# Patient Record
Sex: Male | Born: 1960 | Race: White | Hispanic: No | Marital: Married | Smoking: Former smoker
Health system: Southern US, Community
[De-identification: ages and names within clinical notes are randomized; demographics above are authoritative.]

## PROBLEM LIST (undated history)

## (undated) DIAGNOSIS — I10 Essential (primary) hypertension: Secondary | ICD-10-CM

## (undated) DIAGNOSIS — F32A Depression, unspecified: Secondary | ICD-10-CM

## (undated) DIAGNOSIS — G4730 Sleep apnea, unspecified: Secondary | ICD-10-CM

## (undated) DIAGNOSIS — Z87442 Personal history of urinary calculi: Secondary | ICD-10-CM

---
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
## 2004-06-04 ENCOUNTER — Ambulatory Visit: Payer: Self-pay | Admitting: Internal Medicine

## 2005-05-27 HISTORY — PX: TOE SURGERY: SHX1073

## 2007-04-27 ENCOUNTER — Ambulatory Visit: Payer: Self-pay | Admitting: Podiatry

## 2007-05-06 ENCOUNTER — Ambulatory Visit: Payer: Self-pay | Admitting: Podiatry

## 2007-05-08 ENCOUNTER — Ambulatory Visit: Payer: Self-pay | Admitting: Podiatry

## 2009-08-15 ENCOUNTER — Ambulatory Visit: Payer: Self-pay | Admitting: Urology

## 2012-06-11 ENCOUNTER — Ambulatory Visit: Payer: Self-pay | Admitting: Gastroenterology

## 2013-05-27 HISTORY — PX: BACK SURGERY: SHX140

## 2013-08-31 ENCOUNTER — Ambulatory Visit: Payer: Self-pay | Admitting: Internal Medicine

## 2013-10-10 DIAGNOSIS — M5090 Cervical disc disorder, unspecified, unspecified cervical region: Secondary | ICD-10-CM | POA: Insufficient documentation

## 2014-05-27 HISTORY — PX: BACK SURGERY: SHX140

## 2014-10-27 ENCOUNTER — Other Ambulatory Visit: Payer: Self-pay | Admitting: Internal Medicine

## 2014-10-27 DIAGNOSIS — M5416 Radiculopathy, lumbar region: Secondary | ICD-10-CM

## 2014-10-29 ENCOUNTER — Ambulatory Visit
Admission: RE | Admit: 2014-10-29 | Discharge: 2014-10-29 | Disposition: A | Discharge status: Home / Self Care | Payer: BLUE CROSS/BLUE SHIELD | Source: Ambulatory Visit | Attending: Internal Medicine | Admitting: Internal Medicine

## 2014-10-29 DIAGNOSIS — M5416 Radiculopathy, lumbar region: Secondary | ICD-10-CM

## 2014-10-29 DIAGNOSIS — M5136 Other intervertebral disc degeneration, lumbar region: Secondary | ICD-10-CM | POA: Diagnosis present

## 2014-10-29 DIAGNOSIS — M5126 Other intervertebral disc displacement, lumbar region: Secondary | ICD-10-CM | POA: Insufficient documentation

## 2014-11-22 DIAGNOSIS — F1190 Opioid use, unspecified, uncomplicated: Secondary | ICD-10-CM | POA: Insufficient documentation

## 2014-11-29 DIAGNOSIS — Z9889 Other specified postprocedural states: Secondary | ICD-10-CM | POA: Insufficient documentation

## 2014-11-29 DIAGNOSIS — M48062 Spinal stenosis, lumbar region with neurogenic claudication: Secondary | ICD-10-CM | POA: Insufficient documentation

## 2015-02-01 ENCOUNTER — Encounter: Payer: Self-pay | Admitting: Physical Therapy

## 2015-02-01 ENCOUNTER — Ambulatory Visit: Payer: BLUE CROSS/BLUE SHIELD | Attending: Orthopedic Surgery | Admitting: Physical Therapy

## 2015-02-01 DIAGNOSIS — G729 Myopathy, unspecified: Secondary | ICD-10-CM | POA: Insufficient documentation

## 2015-02-01 DIAGNOSIS — M6281 Muscle weakness (generalized): Secondary | ICD-10-CM | POA: Insufficient documentation

## 2015-02-01 DIAGNOSIS — M6289 Other specified disorders of muscle: Secondary | ICD-10-CM

## 2015-02-01 DIAGNOSIS — M5441 Lumbago with sciatica, right side: Secondary | ICD-10-CM | POA: Insufficient documentation

## 2015-02-01 DIAGNOSIS — M2560 Stiffness of unspecified joint, not elsewhere classified: Secondary | ICD-10-CM | POA: Insufficient documentation

## 2015-02-01 NOTE — Therapy (Deleted)
[facility] [address] [place], [place], [address] Phone: [phone]   Fax:  [phone]  Physical Therapy Evaluation  Patient Details  Name: Darrell Schmidt MRN: [id number] Date of Birth: Mar 13, 1961 Referring Provider:  Marlane Mingle, MD  Encounter Date: 02/01/2015      PT End of Session - 02/01/15 1234    Visit Number 1   Number of Visits 8   Date for PT Re-Evaluation 03/01/15   PT Start Time 1035   PT Stop Time 1117   PT Time Calculation (min) 42 min   Activity Tolerance Patient tolerated treatment well;Patient limited by pain   Behavior During Therapy [facility] for tasks assessed/performed      No past medical history on file.  No past surgical history on file.  There were no vitals filed for this visit.  Visit Diagnosis:  Nonspecific pain in the lumbar region  Muscle tightness  Joint stiffness of spine      Subjective Assessment - 02/01/15 1223    Subjective Pt reports having L L4-L5 decompression on 11/29/14. Pt states that since the decompression he has a constant ache in his back (3/10) at rest and is easily aggrevated by prolonged sitting. 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 Pt reports feeling better when bending back or standing. Pt states he had Right Lumbar (L4/L5 decompression in June of 2015 with immediate relief of symtpoms). Pt states he currently is having some radiating pain into his R buttock and leg above the knee.    Limitations Sitting;Lifting   How long can you sit comfortably? < 10 mins    How long can you stand comfortably? > 15 mins    How long can you walk comfortably? > 15 mins    Diagnostic tests MRI    Patient Stated Goals return to yardwork/playing with son/return to work so able to sit without pain/attend son's baseball game    Currently in Pain? Yes   Pain Score 3    Pain Location Back    Pain Orientation Left;Lower;Right   Pain Descriptors / Indicators Aching   Pain Type Chronic pain   Pain Onset More than a month ago   Pain Frequency Intermittent   Aggravating Factors  sitting   Pain Relieving Factors extension            [facility] PT Assessment - 02/01/15 0001    Assessment   Medical Diagnosis --  Lumbar decompression (L4-5).   Onset Date/Surgical Date 11/29/14                            PT Education - 02/01/15 1232    Education provided Yes   Education Details Pt given stretching (see handout above) page 1-2 of TrA stabilization packet through marching (pain limiting factor with bridging).   Person(s) Educated Patient   Methods Explanation;Demonstration;Verbal cues;Handout   Comprehension Verbalized understanding;Returned demonstration            Plan - 02/01/15 1234    Clinical Impression Statement Pt is a 54 y.o M s/p L L4-L5 lumbar decompression (DOS: 11/29/14). Pt presents with 3/10 low back pain and states that is it constant. Pt reports occassional radiating pain into his R LE. Pt's pain is aggrevating by sitting. Pt lumbar ROM: 50% limited with Lateral flexion and rotation, WNL for  extension, 75% limited flexion secondary to pain. MMT B LE grossly 5/5 except for B hip abductors 4-/5. Pt has positive obers, pirformis and hamstring tightness (distal lacking 21 degrees, proximal 61 degrees). Pt has B negative thomas test. Pt has palapble tenderness and tightness with lumbar musculature (L> R). Pt has a positive L multifiudi arm raise test. Pt is hypomobile from L1-L5 with tenderness noted. Pt will benefit from skilled PT to address functional mobility deficits, address flexibilty deficits, strengthening and stabilization of TrA, address back pain and return to work demands.    Pt will benefit from skilled therapeutic intervention in order to improve on the following deficits Decreased activity tolerance;Hypomobility;Decreased  strength;Decreased mobility;Impaired flexibility;Improper body mechanics;Pain;Decreased endurance;Decreased range of motion   Rehab Potential Good   PT Frequency 2x / week   PT Duration 4 weeks   PT Next Visit Plan progress core strengthening/introduce some cardio/stretching/multifidi strengthening   PT Home Exercise Plan see handout; page 1-2 of core stabilization    Recommended Other Services walking program/some form of cardio exercise    Consulted and Agree with Plan of Care Patient         Problem List There are no active problems to display for this patient.   Curlene Dolphin [facility] 02/01/2015, 12:46 PM  [place] [address] [place], [place], [address] Phone: [phone]   Fax:  [phone]

## 2015-02-02 ENCOUNTER — Encounter: Payer: Self-pay | Admitting: Physical Therapy

## 2015-02-02 NOTE — Therapy (Signed)
[facility] [address] [place], [place], [address] Phone: [phone]   Fax:  [phone]  Physical Therapy Evaluation  Patient Details  Name: Darrell Schmidt MRN: [id number] Date of Birth: Apr 27, 1961 Referring Provider:  Marlane Mingle, MD  Encounter Date: 02/01/2015      PT End of Session - 02/01/15 1234    Visit Number 1   Number of Visits 8   Date for PT Re-Evaluation 03/01/15   PT Start Time 1035   PT Stop Time 1117   PT Time Calculation (min) 42 min   Activity Tolerance Patient tolerated treatment well;Patient limited by pain   Behavior During Therapy [facility] for tasks assessed/performed      History reviewed. No pertinent past medical history.  History reviewed. No pertinent past surgical history.  There were no vitals filed for this visit.  Visit Diagnosis:  Muscle weakness  Muscle tightness  Joint stiffness of spine  Bilateral low back pain with right-sided sciatica      Subjective Assessment - 02/01/15 1223    Subjective Pt reports having L L4-L5 decompression on 11/29/14. Pt states that since the decompression he has a constant ache in his back (3/10) at rest and is easily aggrevated by prolonged sitting. 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 Pt reports feeling better when bending back or standing. Pt states he had Right Lumbar (L4/L5 decompression in June of 2015 with immediate relief of symtpoms). Pt states he currently is having some radiating pain into his R buttock and leg above the knee.    Limitations Sitting;Lifting   How long can you sit comfortably? < 10 mins    How long can you stand comfortably? > 15 mins    How long can you walk comfortably? > 15 mins    Diagnostic tests MRI    Patient Stated Goals return to yardwork/playing with son/return to work so able to sit without pain/attend son's baseball  game    Currently in Pain? Yes   Pain Score 3    Pain Location Back   Pain Orientation Left;Lower;Right   Pain Descriptors / Indicators Aching   Pain Type Chronic pain   Pain Onset More than a month ago   Pain Frequency Intermittent   Aggravating Factors  sitting   Pain Relieving Factors extension      Standing lumbar  Flexion: 32 deg. (guarded/ pain limited).        Extension: 50 deg. (preferred movement).         Rotation: 50% limited (no increase c/o pain)       Lateral flexion: 50% limited (no increase c/o pain)   B LE muscle strength grossly 5/5 MMT except for B hip abductors 4-/5.  Slight increase in L lumbar pain with R hip flexion MMT.   Positive: obers, piriformis Bilaterally  Negative Thomas Test bilaterally  R prox. Hamstring: 68 deg./ distal hamstring: -21 deg. L prox. Hamstring: 65 deg./ distal hamstring:  -22 deg.        PT Education - 02/01/15 1232    Education provided Yes   Education Details Pt given stretching (see handout above) page 1-2 of TrA stabilization packet through marching (pain limiting factor with bridging).   Person(s) Educated Patient   Methods Explanation;Demonstration;Verbal cues;Handout   Comprehension Verbalized understanding;Returned demonstration  PT Long Term Goals - 02/01/15 1243    PT LONG TERM GOAL #1   Title Pt will complete ODI and decrease score to < 20% in order to improve functional mobility.    Baseline Pt took home to complete on 9/7   Time 4   Period Weeks   Status New   PT LONG TERM GOAL #2   Title Pt will report an increase in sitting tolerance with no c/o increased pain with 20 mins or greater in order to return to work.    Time 4   Period Weeks   Status New   PT LONG TERM GOAL #3   Title Pt I with HEP to improve B hip abductors strength by 1/2 MMT grade in order to play outside with his son.    Baseline 4-/5   Time 4   Period Weeks   Status New   PT LONG TERM GOAL #4   Title Pt will be able  to attend son's baseball game (sitting/standing posture) with no increase c/o back pain.   Time 4   Period Weeks   Status New             Plan - 02/01/15 1234    Clinical Impression Statement Pt is a 54 y.o M s/p L L4-L5 lumbar decompression (DOS: 11/29/14). Pt presents with 3/10 low back pain and states that is it constant. Pt reports occassional radiating pain into his R LE. Pt's pain is aggrevating by sitting. Pt lumbar ROM: 50% limited with Lateral flexion and rotation, WNL for extension, 75% limited flexion secondary to pain. MMT B LE grossly 5/5 except for B hip abductors 4-/5. Pt has positive obers, pirformis and hamstring tightness (distal lacking 21 degrees, proximal 61 degrees). Pt has B negative thomas test. Pt has palapble tenderness and tightness with lumbar musculature (L> R). Pt has a positive L multifiudi arm raise test. Pt is hypomobile from L1-L5 with tenderness noted. Pt will benefit from skilled PT to address functional mobility deficits, address flexibilty deficits, strengthening and stabilization of TrA, address back pain and return to work demands.    Pt will benefit from skilled therapeutic intervention in order to improve on the following deficits Decreased activity tolerance;Hypomobility;Decreased strength;Decreased mobility;Impaired flexibility;Improper body mechanics;Pain;Decreased endurance;Decreased range of motion   Rehab Potential Good   PT Frequency 2x / week   PT Duration 4 weeks   PT Next Visit Plan progress core strengthening/introduce some cardio/stretching/multifidi strengthening   PT Home Exercise Plan see handout; page 1-2 of core stabilization    Recommended Other Services walking program/some form of cardio exercise    Consulted and Agree with Plan of Care Patient         Problem List There are no active problems to display for this patient.  Cammie Mcgee, PT, DPT # [phone]   02/02/2015, 8:13 AM  [facility]  [facility] [address] [place], [place], [address] Phone: [phone]   Fax:  [phone]

## 2015-02-03 ENCOUNTER — Ambulatory Visit: Payer: BLUE CROSS/BLUE SHIELD | Admitting: Physical Therapy

## 2015-02-03 DIAGNOSIS — M6281 Muscle weakness (generalized): Secondary | ICD-10-CM | POA: Diagnosis not present

## 2015-02-03 DIAGNOSIS — M5441 Lumbago with sciatica, right side: Secondary | ICD-10-CM

## 2015-02-03 DIAGNOSIS — M6289 Other specified disorders of muscle: Secondary | ICD-10-CM

## 2015-02-03 DIAGNOSIS — M2560 Stiffness of unspecified joint, not elsewhere classified: Secondary | ICD-10-CM

## 2015-02-03 NOTE — Therapy (Signed)
[facility] [address] [place], [place], [address] Phone: [phone]   Fax:  [phone]  Physical Therapy Treatment  Patient Details  Name: Darrell Schmidt MRN: [id number] Date of Birth: 07-23-1960 Referring Provider:  Marlane Mingle, MD  Encounter Date: 02/03/2015      PT End of Session - 02/03/15 1411    Visit Number 2   Number of Visits 8   Date for PT Re-Evaluation 03/01/15   PT Start Time 1055   PT Stop Time 1151   PT Time Calculation (min) 56 min   Activity Tolerance Patient tolerated treatment well;No increased pain   Behavior During Therapy [facility] for tasks assessed/performed      No past medical history on file.  No past surgical history on file.  There were no vitals filed for this visit.  Visit Diagnosis:  Muscle tightness  Joint stiffness of spine  Muscle weakness  Bilateral low back pain with right-sided sciatica      Subjective Assessment - 02/03/15 1250    Subjective Pt reports soreness and occasional burning in low back/ L lower extremity 3/10. Pt reports feeling better with ice and pain medication from yesterday.    Limitations Sitting;Lifting   How long can you sit comfortably? < 10 mins    How long can you stand comfortably? > 15 mins    How long can you walk comfortably? > 15 mins    Diagnostic tests MRI    Patient Stated Goals return to yardwork/playing with son/return to work so able to sit without pain/attend son's baseball game    Currently in Pain? Yes   Pain Score 3    Pain Location Back   Pain Orientation Lower   Pain Descriptors / Indicators Aching;Sharp   Pain Type Surgical pain   Pain Onset More than a month ago   Pain Frequency Intermittent      OBJECTIVE: There ex: TrA activation in supine: pelvic tilts (minor low back discomfort noted)/marching/heel slides x 5 each side. Extension on green therapy ball x 4 with B UE support on // bar. Prone press ups with ESTIM (IFC  pattern to low back, portable unit Modulated II). Pt instructed on how to use ESTIM over the weekend with loaner machine. Manual: B LE/lumbar stretching. STM to paraspinal musculature in lumbar spine/ prone position.  Pt response to Tx for medical necessity: Able to progress with core stability with no increase c/o low back pain. Continues to perform extension exercises to relief pain; pt still limited with sitting tolerance.   .       PT Education - 02/03/15 1254    Education provided Yes   Education Details Pt educated on how to tell if TrA is contracting vs. rectus and the importance of not overtightening the TrA. Pt educated to continue HEP and importance of stretching in pain free motion. Pt educated on use of therapy ball for supported extension at home. Pt educated on proper use of TENS unit and electrode placement. Pt educated on prone pressups.    Person(s) Educated Patient   Methods Explanation;Demonstration;Verbal cues   Comprehension Verbalized understanding;Returned demonstration             PT Long Term Goals - 02/01/15 1243    PT LONG TERM GOAL #1   Title Pt will complete ODI and decrease score to < 20% in order to improve functional mobility.    Baseline Pt took home to complete on 9/7  Time 4   Period Weeks   Status New   PT LONG TERM GOAL #2   Title Pt will report an increase in sitting tolerance with no c/o increased pain with 20 mins or greater in order to return to work.    Time 4   Period Weeks   Status New   PT LONG TERM GOAL #3   Title Pt I with HEP to improve B hip abductors strength by 1/2 MMT grade in order to play outside with his son.    Baseline 4-/5   Time 4   Period Weeks   Status New   PT LONG TERM GOAL #4   Title Pt will be able to attend son's baseball game (sitting/standing posture) with no increase c/o back pain.   Time 4   Period Weeks   Status New            Plan - 02/03/15 1412    Clinical Impression Statement Pt continues  to progress with core stability/TrA contraction with no increased c/o of pain. Pt reports an aching pain in lumbar spine (L>R). Noted muscle tightness on the entire L spine versus R spine. Pt continues to prefer extension and has no increased N/T or radiating symptoms with extension. Pt tried ESTM for acute inflammation with prone pressups and given loaner for over the weekend.    Pt will benefit from skilled therapeutic intervention in order to improve on the following deficits Decreased activity tolerance;Hypomobility;Decreased strength;Decreased mobility;Impaired flexibility;Improper body mechanics;Pain;Decreased endurance;Decreased range of motion   Rehab Potential Good   PT Frequency 2x / week   PT Duration 4 weeks   PT Next Visit Plan progress core strengthening/introduce some cardio/stretching/multifidi strengthening   PT Home Exercise Plan see handout; page 1-2 of core stabilization    Consulted and Agree with Plan of Care Patient        Problem List There are no active problems to display for this patient.  Cammie Mcgee, PT, DPT # [phone]   02/03/2015, 5:13 PM  [facility] [address] [place], [place], [address] Phone: [phone]   Fax:  [phone]

## 2015-02-07 ENCOUNTER — Encounter: Payer: Self-pay | Admitting: Physical Therapy

## 2015-02-07 ENCOUNTER — Ambulatory Visit: Payer: BLUE CROSS/BLUE SHIELD | Admitting: Physical Therapy

## 2015-02-07 DIAGNOSIS — M6281 Muscle weakness (generalized): Secondary | ICD-10-CM

## 2015-02-07 DIAGNOSIS — M5441 Lumbago with sciatica, right side: Secondary | ICD-10-CM

## 2015-02-07 DIAGNOSIS — M6289 Other specified disorders of muscle: Secondary | ICD-10-CM

## 2015-02-07 DIAGNOSIS — M2560 Stiffness of unspecified joint, not elsewhere classified: Secondary | ICD-10-CM

## 2015-02-07 NOTE — Therapy (Deleted)
[place] [facility] [place] [facility] [address] [place], [place], [address] Phone: [phone]   Fax:  [phone]  Physical Therapy Treatment  Patient Details  Name: Darrell Schmidt MRN: [id number] Date of Birth: 19-Jan-1961 Referring Provider:  Marlane Mingle, MD  Encounter Date: 02/07/2015      PT End of Session - 02/07/15 1357    Visit Number 3   Number of Visits 8   Date for PT Re-Evaluation 03/01/15   PT Start Time 1350   PT Stop Time 1435   PT Time Calculation (min) 45 min   Activity Tolerance Patient tolerated treatment well;No increased pain   Behavior During Therapy [facility] for tasks assessed/performed      History reviewed. No pertinent past medical history.  History reviewed. No pertinent past surgical history.  There were no vitals filed for this visit.  Visit Diagnosis:  Muscle tightness  Muscle weakness  Joint stiffness of spine  Bilateral low back pain with right-sided sciatica      Subjective Assessment - 02/07/15 1353    Subjective Pt reports LBP at 4/10. Pt states symptoms were increased post treatment session last week. Pt reports pain over the weekend reaching a 6/10 but finding relief from TENS unit.    Limitations Sitting;Lifting   How long can you sit comfortably? < 10 mins    How long can you stand comfortably? > 15 mins    How long can you walk comfortably? > 15 mins    Diagnostic tests MRI    Patient Stated Goals return to yardwork/playing with son/return to work so able to sit without pain/attend son's baseball game    Currently in Pain? Yes   Pain Score 4    Pain Location Back   Pain Orientation Lower   Pain Descriptors / Indicators Aching;Sharp   Pain Type Surgical pain   Pain Onset More than a month ago   Pain Frequency Intermittent   Aggravating Factors  sitting   Pain Relieving Factors extension       OBJECTIVE: There ex: TrA activation in supine: supine leg lifts x 10. Supine ball knees to  chest. In quadraped, alternating arm/leg/bird dog 15 x 2. Manual: B LE/lumbar stretching focusing on piriformis. STM to errector spinae musculature to decrease tightness (L>R, relief noted after). Pt educated on how to perform STM at home.     Pt response to Tx for medical necessity: Able to progress with core stability with no increase c/o low back pain. Continues to perform extension exercises to relief pain; pt still limited with sitting tolerance and finding relief from TENS to increase sitting tolerance. Pt is unable to sit longer than half an hour before increased pain.          PT Long Term Goals - 02/01/15 1243    PT LONG TERM GOAL #1   Title Pt will complete ODI and decrease score to < 20% in order to improve functional mobility.    Baseline Pt took home to complete on 9/7   Time 4   Period Weeks   Status New   PT LONG TERM GOAL #2   Title Pt will report an increase in sitting tolerance with no c/o increased pain with 20 mins or greater in order to return to work.    Time 4   Period Weeks   Status New   PT LONG TERM GOAL #3   Title Pt I with HEP to improve B hip abductors strength by  1/2 MMT grade in order to play outside with his son.    Baseline 4-/5   Time 4   Period Weeks   Status New   PT LONG TERM GOAL #4   Title Pt will be able to attend son's baseball game (sitting/standing posture) with no increase c/o back pain.   Time 4   Period Weeks   Status New      Problem List There are no active problems to display for this patient.  Cammie Mcgee, PT, DPT # [phone]   02/07/2015, 4:24 PM  [facility] [address] [place], [place], [address] Phone: [phone]   Fax:  [phone]

## 2015-02-07 NOTE — Therapy (Signed)
[facility] [address] [place], [place], [address] Phone: [phone]   Fax:  [phone]  Physical Therapy Treatment  Patient Details  Name: Darrell Schmidt MRN: [id number] Date of Birth: [birth year] Referring Provider:  Marlane Mingle, MD  Encounter Date: 02/07/2015      PT End of Session - 02/07/15 1357    Visit Number 3   Number of Visits 8   Date for PT Re-Evaluation 03/01/15   PT Start Time 1350   PT Stop Time 1435   PT Time Calculation (min) 45 min   Activity Tolerance Patient tolerated treatment well;No increased pain   Behavior During Therapy [facility] for tasks assessed/performed      History reviewed. No pertinent past medical history.  History reviewed. No pertinent past surgical history.  There were no vitals filed for this visit.  Visit Diagnosis:  Muscle tightness  Muscle weakness  Joint stiffness of spine  Bilateral low back pain with right-sided sciatica      Subjective Assessment - 02/07/15 1353    Subjective Pt reports LBP at 4/10. Pt states symptoms were increased post treatment session last week. Pt reports pain over the weekend reaching a 6/10 but finding relief from TENS unit.    Limitations Sitting;Lifting   How long can you sit comfortably? < 10 mins    How long can you stand comfortably? > 15 mins    How long can you walk comfortably? > 15 mins    Diagnostic tests MRI    Patient Stated Goals return to yardwork/playing with son/return to work so able to sit without pain/attend son's baseball game    Currently in Pain? Yes   Pain Score 4    Pain Location Back   Pain Orientation Lower   Pain Descriptors / Indicators Aching;Sharp   Pain Type Surgical pain   Pain Onset More than a month ago   Pain Frequency Intermittent   Aggravating Factors  sitting   Pain Relieving Factors extension       OBJECTIVE: There ex: TrA activation in supine: supine leg lifts x 10. Supine ball knees to  chest. In quadraped, alternating arm/leg/bird dog 15 x 2. Manual: B LE/lumbar stretching focusing on piriformis. STM to errector spinae musculature to decrease tightness (L>R, relief noted after). Pt educated on how to perform STM at home.     Pt response to Tx for medical necessity: Able to progress with core stability with no increase c/o low back pain. Continues to perform extension exercises to relief pain; pt still limited with sitting tolerance and finding relief from TENS to increase sitting tolerance. Pt is unable to sit longer than half an hour before increased pain.         PT Long Term Goals - 02/01/15 1243    PT LONG TERM GOAL #1   Title Pt will complete ODI and decrease score to < 20% in order to improve functional mobility.    Baseline Pt took home to complete on 9/7   Time 4   Period Weeks   Status New   PT LONG TERM GOAL #2   Title Pt will report an increase in sitting tolerance with no c/o increased pain with 20 mins or greater in order to return to work.    Time 4   Period Weeks   Status New   PT LONG TERM GOAL #3   Title Pt I with HEP to improve B hip abductors strength by 1/2  MMT grade in order to play outside with his son.    Baseline 4-/5   Time 4   Period Weeks   Status New   PT LONG TERM GOAL #4   Title Pt will be able to attend son's baseball game (sitting/standing posture) with no increase c/o back pain.   Time 4   Period Weeks   Status New            Plan - 02/07/15 1515    Clinical Impression Statement Pt progressing with core stability/TrA contraction with straight leg raise without increased c/o pain. Pt able to use quadraped to increase core activation today. Pt reports mild discomfort with quadraped but is relieved with changing of posiitions. Pt  erector spinae musculature is still tight but able to get some relief with STM.    Pt will benefit from skilled therapeutic intervention in order to improve on the following deficits Decreased activity  tolerance;Hypomobility;Decreased strength;Decreased mobility;Impaired flexibility;Improper body mechanics;Pain;Decreased endurance;Decreased range of motion   Rehab Potential Good   PT Frequency 2x / week   PT Duration 4 weeks   PT Next Visit Plan progress core strengthening/introduce some cardio/stretching/multifidi strengthening/STM to errecator spinae musculature    PT Home Exercise Plan knees to chest/quadraped alternating arm and leg/continue TrA contraction pages 1-2   Recommended Other Services walking   Consulted and Agree with Plan of Care Patient        Problem List There are no active problems to display for this patient.  Cammie Mcgee, PT, DPT # [phone]   02/08/2015, 9:24 AM  [place] [facility] [address] [place], [place], [address] Phone: [phone]   Fax:  [phone]

## 2015-02-09 ENCOUNTER — Encounter: Payer: Self-pay | Admitting: Physical Therapy

## 2015-02-09 ENCOUNTER — Ambulatory Visit: Payer: BLUE CROSS/BLUE SHIELD | Admitting: Physical Therapy

## 2015-02-09 DIAGNOSIS — M5441 Lumbago with sciatica, right side: Secondary | ICD-10-CM

## 2015-02-09 DIAGNOSIS — M2560 Stiffness of unspecified joint, not elsewhere classified: Secondary | ICD-10-CM

## 2015-02-09 DIAGNOSIS — M6281 Muscle weakness (generalized): Secondary | ICD-10-CM | POA: Diagnosis not present

## 2015-02-09 DIAGNOSIS — M6289 Other specified disorders of muscle: Secondary | ICD-10-CM

## 2015-02-09 NOTE — Therapy (Signed)
[place] [facility] [address] [place], [place], [address] Phone: [phone]   Fax:  [phone]  Physical Therapy Treatment  Patient Details  Name: IBRAHEEM VORIS MRN: [id number] Date of Birth: Oct 23, 1960 Referring Provider:  Marlane Mingle, MD  Encounter Date: 02/09/2015      PT End of Session - 02/09/15 1657    Visit Number 4   Number of Visits 8   Date for PT Re-Evaluation 03/01/15   PT Start Time 1357   PT Stop Time 1439   PT Time Calculation (min) 42 min   Activity Tolerance Patient tolerated treatment well;No increased pain   Behavior During Therapy [facility] for tasks assessed/performed      History reviewed. No pertinent past medical history.  History reviewed. No pertinent past surgical history.  There were no vitals filed for this visit.  Visit Diagnosis:  Muscle tightness  Muscle weakness  Joint stiffness of spine  Bilateral low back pain with right-sided sciatica      Subjective Assessment - 02/09/15 1655    Subjective Pt reports L sided low back pain at a 3/10 at rest. Pt report pain being unbearable over yesterday and early this morning.    Limitations Sitting;Lifting   How long can you sit comfortably? < 10 mins    How long can you stand comfortably? > 15 mins    How long can you walk comfortably? > 15 mins    Diagnostic tests MRI    Patient Stated Goals return to yardwork/playing with son/return to work so able to sit without pain/attend son's baseball game    Currently in Pain? Yes   Pain Score 3    Pain Location Back   Pain Orientation Left;Lower   Pain Descriptors / Indicators Dull   Pain Type Chronic pain   Pain Onset More than a month ago   Pain Frequency Constant        OBJECTIVE: There ex: TrA activation in seated on blue therapy ball: Alternating LE/UE x 20. Green therapy ball: seated marching/straight leg raise x 20 each. Extension over green therapy ball with B UE support. Supine  ball knees to chest.  Manual: B LE/lumbar stretching focusing on piriformis. STM to errector spinae musculature to decrease tightness (L>R, relief noted after). Pt educated on importance of progressing motion in flexion in order to sit for longer durations to return to work.  Pt response to Tx for medical necessity: Able to progress with core stability with no increase c/o low back pain. Continues to perform extension exercises to relief pain. Limited sitting tolerance limiting pt's return to work.          PT Long Term Goals - 02/01/15 1243    PT LONG TERM GOAL #1   Title Pt will complete ODI and decrease score to < 20% in order to improve functional mobility.    Baseline Pt took home to complete on 9/7   Time 4   Period Weeks   Status New   PT LONG TERM GOAL #2   Title Pt will report an increase in sitting tolerance with no c/o increased pain with 20 mins or greater in order to return to work.    Time 4   Period Weeks   Status New   PT LONG TERM GOAL #3   Title Pt I with HEP to improve B hip abductors strength by 1/2 MMT grade in order to play outside with his son.    Baseline  4-/5   Time 4   Period Weeks   Status New   PT LONG TERM GOAL #4   Title Pt will be able to attend son's baseball game (sitting/standing posture) with no increase c/o back pain.   Time 4   Period Weeks   Status New               Plan - 02/09/15 1657    Clinical Impression Statement Pt able to perform knees to chest without increased c/o LBP with white therapy ball. Pt still prefers extended spine positioning but was able to tolerate minimal quadraped on blue therapy ball activation. Pt able to progress from supine to seated therapy ball core stability.    Pt will benefit from skilled therapeutic intervention in order to improve on the following deficits Decreased activity tolerance;Hypomobility;Decreased strength;Decreased mobility;Impaired flexibility;Improper body  mechanics;Pain;Decreased endurance;Decreased range of motion   Rehab Potential Good   PT Frequency 2x / week   PT Duration 4 weeks   PT Next Visit Plan Multifidi strengthening/core strengthening/PROGRESS NOTE TO MD    PT Home Exercise Plan knees to chest/quadraped alternating arm and leg/continue TrA contraction pages 1-2   Consulted and Agree with Plan of Care Patient        Problem List There are no active problems to display for this patient.  Cammie Mcgee, PT, DPT # [phone]   02/10/2015, 4:13 PM  [place] [facility] [address] [place], [place], [address] Phone: [phone]   Fax:  [phone]

## 2015-02-14 ENCOUNTER — Ambulatory Visit: Payer: BLUE CROSS/BLUE SHIELD | Admitting: Physical Therapy

## 2015-02-14 DIAGNOSIS — M5441 Lumbago with sciatica, right side: Secondary | ICD-10-CM

## 2015-02-14 DIAGNOSIS — M6281 Muscle weakness (generalized): Secondary | ICD-10-CM | POA: Diagnosis not present

## 2015-02-14 DIAGNOSIS — M6289 Other specified disorders of muscle: Secondary | ICD-10-CM

## 2015-02-14 DIAGNOSIS — M2560 Stiffness of unspecified joint, not elsewhere classified: Secondary | ICD-10-CM

## 2015-02-15 NOTE — Therapy (Signed)
[facility] [address]. [place], [place], [address] Phone: [phone]   Fax:  [phone]  Physical Therapy Treatment  Patient Details  Name: Darrell Schmidt MRN: [id number] Date of Birth: 1960/12/14 Referring Provider:  Loni Dolly, MD  Encounter Date: 02/14/2015      PT End of Session - 02/15/15 0704    Visit Number 5   Number of Visits 8   Date for PT Re-Evaluation 03/01/15   PT Start Time 1410   PT Stop Time 1451   PT Time Calculation (min) 41 min   Activity Tolerance Patient tolerated treatment well;No increased pain   Behavior During Therapy [facility] for tasks assessed/performed      No past medical history on file.  No past surgical history on file.  There were no vitals filed for this visit.  Visit Diagnosis:  Muscle tightness  Muscle weakness  Joint stiffness of spine  Bilateral low back pain with right-sided sciatica      Subjective Assessment - 02/15/15 0703    Subjective Pt reports 4/10 L sided low back pain currently at rest.  Pt reports increase "sharp" low back pain over weekend.  Pt reports continued discomfort with sitting in a chair for a prolonged time (<1 hour at a time).    Limitations Sitting;Lifting   How long can you sit comfortably? < 10 mins    How long can you stand comfortably? > 15 mins    How long can you walk comfortably? > 15 mins    Diagnostic tests MRI    Patient Stated Goals return to yardwork/playing with son/return to work so able to sit without pain/attend son's baseball game    Currently in Pain? Yes   Pain Score 4    Pain Location Back   Pain Orientation Lower;Left   Pain Descriptors / Indicators Constant   Pain Type Chronic pain   Pain Onset More than a month ago   Pain Frequency Constant      OBJECTIVE:  Modified Oswestry Disability Index: 60%, self-perceived extreme disability.  There ex: TrA activation in seated on green therapy ball: Alternating LE/UE x  30/ Seated SLR x 20/ seated marching x 20/shoulder press/biceps x 20 each. Supine extension over green therapy ball between each set of exercises. Standing walk outs with Nautilus 20# (c/o pain when walking out to L side).  Reviewed core stability program and educated on importance of stabilizing lumbar musculature.   Pt response to Tx for medical necessity: Core stability progressing but still limited by c/o low back pain with all tasks. Postural correction cues needed minimally to maintain and fix posture.       PT Long Term Goals - 02/15/15 0707    PT LONG TERM GOAL #1   Title Pt will complete ODI and decrease score to < 20% in order to improve functional mobility.    Baseline 60% self-perceived extreme disability on 9/20   Time 4   Period Weeks   Status Not Met   PT LONG TERM GOAL #2   Title Pt will report an increase in sitting tolerance with no c/o increased pain with 20 mins or greater in order to return to work.    Baseline Pt is able to sit for 1 hour with multiple positional changes before having to lie down or take pain medication    Time 4   Period Weeks   Status Partially Met   PT LONG TERM GOAL #3  Title Pt I with HEP to improve B hip abductors strength by 1/2 MMT grade in order to play outside with his son.    Baseline 4-/5   Time 4   Period Weeks   Status On-going   PT LONG TERM GOAL #4   Title Pt will be able to attend son's baseball game (sitting/standing posture) with no increase c/o back pain.   Baseline Pt has not attended a game yet due to pain and heat.    Time 4   Period Weeks   Status Not Met            Plan - 02/15/15 0704    Clinical Impression Statement Pt is I with TrA contraction and hold in supine. Pt progressing to TrA contraction with UE movevements and marching/alternating arm and leg on the therapy ball; pt limited by pain in seated posture. Pt continues to be limited with stability exercises and progression secondary to low back  pain and fear of not being able to move post tx session. ODI: 60%, self perceived extreme disability. Pt to follow up with MD thursday 9/22.    Pt will benefit from skilled therapeutic intervention in order to improve on the following deficits Decreased activity tolerance;Hypomobility;Decreased strength;Decreased mobility;Impaired flexibility;Improper body mechanics;Pain;Decreased endurance;Decreased range of motion   Rehab Potential Good   PT Frequency 2x / week   PT Duration 4 weeks   PT Next Visit Plan Multifidi strengthening/core strengthening progressing/increasing sitting tolerance by increasing tolerance of flexed posture    PT Home Exercise Plan continue same progression   Recommended Other Services water therapy    Consulted and Agree with Plan of Care Patient        Problem List There are no active problems to display for this patient.  Pura Spice, PT, DPT # [phone]   02/15/2015, 1:49 PM  [place] [address] [place], [place], [address] Phone: [phone]   Fax:  [phone]

## 2015-02-16 ENCOUNTER — Encounter: Payer: BLUE CROSS/BLUE SHIELD | Admitting: Physical Therapy

## 2015-02-21 ENCOUNTER — Encounter: Payer: BLUE CROSS/BLUE SHIELD | Admitting: Physical Therapy

## 2015-02-23 ENCOUNTER — Encounter: Payer: BLUE CROSS/BLUE SHIELD | Admitting: Physical Therapy

## 2015-02-28 ENCOUNTER — Encounter: Payer: BLUE CROSS/BLUE SHIELD | Admitting: Physical Therapy

## 2015-03-02 ENCOUNTER — Encounter: Payer: BLUE CROSS/BLUE SHIELD | Admitting: Physical Therapy

## 2016-10-14 ENCOUNTER — Other Ambulatory Visit: Payer: Self-pay | Admitting: Neurosurgery

## 2016-10-14 DIAGNOSIS — M961 Postlaminectomy syndrome, not elsewhere classified: Secondary | ICD-10-CM

## 2016-11-01 ENCOUNTER — Ambulatory Visit: Payer: BLUE CROSS/BLUE SHIELD

## 2017-03-03 DIAGNOSIS — E538 Deficiency of other specified B group vitamins: Secondary | ICD-10-CM | POA: Insufficient documentation

## 2017-03-03 DIAGNOSIS — E782 Mixed hyperlipidemia: Secondary | ICD-10-CM | POA: Insufficient documentation

## 2019-04-30 DIAGNOSIS — Z0000 Encounter for general adult medical examination without abnormal findings: Secondary | ICD-10-CM | POA: Insufficient documentation

## 2019-08-12 ENCOUNTER — Ambulatory Visit: Payer: BLUE CROSS/BLUE SHIELD | Attending: Internal Medicine

## 2019-08-12 DIAGNOSIS — Z23 Encounter for immunization: Secondary | ICD-10-CM

## 2019-08-12 NOTE — Progress Notes (Signed)
   Covid-19 Vaccination Clinic  Name:  Darrell Schmidt    MRN: [id number] DOB: [birth year]  08/12/2019  Darrell Schmidt was observed post Covid-19 immunization for 15 minutes without incident. He was provided with Vaccine Information Sheet and instruction to access the V-Safe system.   [name] was instructed to call 911 with any severe reactions post vaccine: [name] Kitchen Difficulty breathing  . Swelling of face and throat  . A fast heartbeat  . A bad rash all over body  . Dizziness and weakness   Immunizations Administered    Name Date Dose VIS Date Route   Pfizer COVID-19 Vaccine 08/12/2019 10:58 AM 0.3 mL 05/07/2019 Intramuscular   Manufacturer: [employer], [employer]   Lot: [id number]   NDC: [address]-6

## 2019-09-07 ENCOUNTER — Ambulatory Visit: Payer: BLUE CROSS/BLUE SHIELD | Attending: Internal Medicine

## 2019-09-07 DIAGNOSIS — Z23 Encounter for immunization: Secondary | ICD-10-CM

## 2019-09-07 NOTE — Progress Notes (Signed)
   Covid-19 Vaccination Clinic  Name:  Darrell Schmidt    MRN: [id number] DOB: April 01, 1961  09/07/2019  Darrell Schmidt was observed post Covid-19 immunization for 15 minutes without incident. He was provided with Vaccine Information Sheet and instruction to access the V-Safe system.   Darrell Schmidt was instructed to call 911 with any severe reactions post vaccine: [name] Kitchen Difficulty breathing  . Swelling of face and throat  . A fast heartbeat  . A bad rash all over body  . Dizziness and weakness   Immunizations Administered    Name Date Dose VIS Date Route   Pfizer COVID-19 Vaccine 09/07/2019 12:07 PM 0.3 mL 05/07/2019 Intramuscular   Manufacturer: [employer], [employer]   Lot: [id number]   NDC: [address]-2

## 2020-04-14 ENCOUNTER — Other Ambulatory Visit: Payer: Self-pay | Admitting: Internal Medicine

## 2020-04-14 DIAGNOSIS — R10811 Right upper quadrant abdominal tenderness: Secondary | ICD-10-CM

## 2020-04-25 ENCOUNTER — Other Ambulatory Visit: Payer: Self-pay

## 2020-04-25 ENCOUNTER — Ambulatory Visit
Admission: RE | Admit: 2020-04-25 | Discharge: 2020-04-25 | Disposition: A | Discharge status: Home / Self Care | Payer: Medicare Other | Source: Ambulatory Visit | Attending: Internal Medicine | Admitting: Internal Medicine

## 2020-04-25 DIAGNOSIS — R10811 Right upper quadrant abdominal tenderness: Secondary | ICD-10-CM | POA: Diagnosis not present

## 2020-05-02 DIAGNOSIS — F17200 Nicotine dependence, unspecified, uncomplicated: Secondary | ICD-10-CM | POA: Insufficient documentation

## 2020-06-14 DIAGNOSIS — G4733 Obstructive sleep apnea (adult) (pediatric): Secondary | ICD-10-CM | POA: Insufficient documentation

## 2020-06-29 ENCOUNTER — Ambulatory Visit: Payer: Medicare Other | Admitting: Student in an Organized Health Care Education/Training Program

## 2020-07-04 ENCOUNTER — Other Ambulatory Visit: Payer: Self-pay | Admitting: Internal Medicine

## 2020-07-04 DIAGNOSIS — M5010 Cervical disc disorder with radiculopathy, unspecified cervical region: Secondary | ICD-10-CM

## 2020-07-23 ENCOUNTER — Ambulatory Visit
Admission: RE | Admit: 2020-07-23 | Discharge: 2020-07-23 | Disposition: A | Discharge status: Home / Self Care | Payer: Medicare Other | Source: Ambulatory Visit | Attending: Internal Medicine | Admitting: Internal Medicine

## 2020-07-23 DIAGNOSIS — M5010 Cervical disc disorder with radiculopathy, unspecified cervical region: Secondary | ICD-10-CM

## 2020-07-24 ENCOUNTER — Other Ambulatory Visit: Payer: Self-pay | Admitting: Internal Medicine

## 2020-07-24 DIAGNOSIS — Z0189 Encounter for other specified special examinations: Secondary | ICD-10-CM

## 2020-07-24 DIAGNOSIS — M5010 Cervical disc disorder with radiculopathy, unspecified cervical region: Secondary | ICD-10-CM

## 2020-07-24 DIAGNOSIS — Z1389 Encounter for screening for other disorder: Secondary | ICD-10-CM

## 2020-07-27 ENCOUNTER — Ambulatory Visit
Admission: RE | Admit: 2020-07-27 | Discharge: 2020-07-27 | Disposition: A | Discharge status: Home / Self Care | Payer: Medicare Other | Source: Ambulatory Visit | Attending: Internal Medicine | Admitting: Internal Medicine

## 2020-07-27 ENCOUNTER — Other Ambulatory Visit: Payer: Self-pay

## 2020-07-27 DIAGNOSIS — Z1389 Encounter for screening for other disorder: Secondary | ICD-10-CM

## 2020-07-27 DIAGNOSIS — M5010 Cervical disc disorder with radiculopathy, unspecified cervical region: Secondary | ICD-10-CM

## 2020-08-04 ENCOUNTER — Ambulatory Visit
Admission: RE | Admit: 2020-08-04 | Discharge: 2020-08-04 | Disposition: A | Discharge status: Home / Self Care | Payer: Medicare Other | Source: Ambulatory Visit | Attending: Physician Assistant | Admitting: Physician Assistant

## 2020-08-04 ENCOUNTER — Other Ambulatory Visit: Payer: Self-pay

## 2020-08-04 ENCOUNTER — Other Ambulatory Visit: Payer: Self-pay | Admitting: Physician Assistant

## 2020-08-04 DIAGNOSIS — M5010 Cervical disc disorder with radiculopathy, unspecified cervical region: Secondary | ICD-10-CM

## 2020-08-04 DIAGNOSIS — R058 Other specified cough: Secondary | ICD-10-CM | POA: Diagnosis not present

## 2020-08-04 DIAGNOSIS — R590 Localized enlarged lymph nodes: Secondary | ICD-10-CM | POA: Insufficient documentation

## 2020-08-04 HISTORY — DX: Essential (primary) hypertension: I10

## 2020-08-04 LAB — POCT I-STAT CREATININE: Creatinine, Ser: 0.8 mg/dL (ref 0.61–1.24)

## 2020-08-04 MED ORDER — IOHEXOL 300 MG/ML  SOLN
75.0000 mL | Freq: Once | INTRAMUSCULAR | Status: AC | PRN
  Administered 2020-08-04: 75 mL via INTRAVENOUS

## 2020-09-04 ENCOUNTER — Other Ambulatory Visit: Payer: Self-pay | Admitting: Neurosurgery

## 2020-09-13 ENCOUNTER — Other Ambulatory Visit
Admission: RE | Admit: 2020-09-13 | Discharge: 2020-09-13 | Disposition: A | Discharge status: Home / Self Care | Payer: Medicare Other | Source: Ambulatory Visit | Attending: Neurosurgery | Admitting: Neurosurgery

## 2020-09-13 ENCOUNTER — Other Ambulatory Visit: Payer: Self-pay

## 2020-09-13 ENCOUNTER — Other Ambulatory Visit: Payer: Medicare Other

## 2020-09-13 DIAGNOSIS — Z01818 Encounter for other preprocedural examination: Secondary | ICD-10-CM | POA: Diagnosis present

## 2020-09-13 HISTORY — DX: Depression, unspecified: F32.A

## 2020-09-13 HISTORY — DX: Sleep apnea, unspecified: G47.30

## 2020-09-13 HISTORY — DX: Personal history of urinary calculi: Z87.442

## 2020-09-13 LAB — URINALYSIS, ROUTINE W REFLEX MICROSCOPIC
Bilirubin Urine: NEGATIVE
Glucose, UA: NEGATIVE mg/dL
Hgb urine dipstick: NEGATIVE
Ketones, ur: 5 mg/dL — AB
Leukocytes,Ua: NEGATIVE
Nitrite: NEGATIVE
Protein, ur: NEGATIVE mg/dL
Specific Gravity, Urine: 1.027 (ref 1.005–1.030)
pH: 5 (ref 5.0–8.0)

## 2020-09-13 LAB — CBC
HCT: 39.8 % (ref 39.0–52.0)
Hemoglobin: 13.5 g/dL (ref 13.0–17.0)
MCH: 30.7 pg (ref 26.0–34.0)
MCHC: 33.9 g/dL (ref 30.0–36.0)
MCV: 90.5 fL (ref 80.0–100.0)
Platelets: 294 10*3/uL (ref 150–400)
RBC: 4.4 MIL/uL (ref 4.22–5.81)
RDW: 13.2 % (ref 11.5–15.5)
WBC: 10 10*3/uL (ref 4.0–10.5)
nRBC: 0 % (ref 0.0–0.2)

## 2020-09-13 LAB — BASIC METABOLIC PANEL
Anion gap: 9 (ref 5–15)
BUN: 16 mg/dL (ref 6–20)
CO2: 27 mmol/L (ref 22–32)
Calcium: 9.3 mg/dL (ref 8.9–10.3)
Chloride: 103 mmol/L (ref 98–111)
Creatinine, Ser: 0.8 mg/dL (ref 0.61–1.24)
GFR, Estimated: >60 mL/min (ref >60)
Glucose, Bld: 127 mg/dL — ABNORMAL HIGH (ref 70–99)
Potassium: 3.9 mmol/L (ref 3.5–5.1)
Sodium: 139 mmol/L (ref 135–145)

## 2020-09-13 LAB — SURGICAL PCR SCREEN
MRSA, PCR: NEGATIVE
Staphylococcus aureus: NEGATIVE

## 2020-09-13 LAB — TYPE AND SCREEN
ABO/RH(D): O POS
Antibody Screen: NEGATIVE

## 2020-09-13 LAB — PROTIME-INR
INR: 0.9 (ref 0.8–1.2)
Prothrombin Time: 12.2 seconds (ref 11.4–15.2)

## 2020-09-13 LAB — APTT: aPTT: 28 seconds (ref 24–36)

## 2020-09-13 NOTE — Patient Instructions (Addendum)
Your procedure is scheduled on: 09-20-2020 Wednesday  Report to the Registration Desk on the 1st floor of the Medical Mall- Then proceed to the 2nd floor Surgery Desk on the Medical Mall To find out your arrival time please call [phone] between 1PM - 3PM on 09-19-20 TUESDAY  Remember: Instructions that are not followed completely may result in serious medical risk,  up to and including death, or upon the discretion of your surgeon and anesthesiologist your  surgery may need to be rescheduled.   Do not eat food after midnight the night before your procedure. No gum chewing, lozengers or hard candies.  You may however, drink WATER up to 2 hours before you are scheduled to arrive for your surgery- Do not drink anything within   2 hours of your scheduled arrival time.  Type 1 and Type 2 diabetics should only drink water.  TAKE THESE MEDICATIONS THE MORNING OF SURGERY WITH A SIP OF WATER: - amLODipine (NORVASC) 5 MG  - DULoxetine (CYMBALTA) 20 MG - gabapentin (NEURONTIN) 300 MG - pantoprazole (PROTONIX) 40 MG - HYDROcodone-acetaminophen (NORCO/VICODIN) 5-325 MG - traMADol (ULTRAM) 50 MG   One week prior to surgery: Stop anti-inflammatories (NSAIDS) such as Advil, Aleve, Ibuprofen, , Motrin, naproxen, Naprosyn and Aspirin based products such as Excedrin, Goody;s BC Powder- OK TO TAKE TYLENOL IF NEEDED.   Stop ANY OVER THE COUNTER supplements until after surgery-    No Alcohol for 24 hours before or after surgery.  Do Not Smoke or use e-cigarettes For 24 Hours Prior to Your Surgery. No chewable tobacco products for at least 6 hours prior to surgery. No nicotine patches on the day of surgery.  Do not use any "recreational" drugs for at least one week prior to your surgery.   Please be advised that the combination of cocaine and anesthesia may have negative outcomes, up to and including death.  If you test positive for cocaine, your surgery will be  cancelled.  On the morning of surgery brush your teeth with toothpaste and water, you may rinse your mouth with mouthwash if you wish.   Do not swallow any toothpaste of mouthwash.     Do not wear jewelry, make-up, hairpins, clips or nail polish.  Do not wear lotions, powders, or perfumes.   Do not shave body from the neck down 48 hours prior to surgery just in case you cut yourself which could also leave a site for infection. Also, freshly shave skin may become irritates if using CHG soap.  Contacts, hearing aids and dentures may not be worn into surgery.  Do not bring valuables to the hospital. [facility] is not responsible for any missing/lost belongings or valuables.   Use CHG Soap as directed on instruction sheet.  Notify your doctor if there is any change in your medical condition (cold, fever, infection).   Wear comfortable clothing (specific to your surgery type) to the hospital.  Plan for stool softeners for home use; pain medications have a tendency t cause constipation.  You can also help prevent constipation by eating foods high in fiber such as fruits and vegetables and drinking plenty of fluids as your diet allows.  After surgery, you can help prevent lung complications by doing breathing exercises. Take deep breaths and cough every 1-2 hours. Your doctor may order a device called an Incentive Spirometer to help you take deep breaths. When coughing or sneezing, hold a pillow firmly against your incision. It also decreases belly discomfort.  If you  are being admitted to the hospital overnight, leave your suitcase in the car.  After surgery it may be brought to your room.  If you are being discharged the day of surgery, you will not be allowed to drive home.   You will need a responsible adult (18 years or older) to drive you home and stay with you that night.  If you are taking public transportation, you will need to have a responsible adult (18 years r older) with  you. Please confirm with your physician that it is acceptable to use public transportation.  Please call the Pre-admissions Testing Dept. at [phone]) if you have any questions about these instructions.   Surgery Visitation Policy:  Patients undergoing a surgery or procedure may have one family member or support person with them as long as that person is not COVID-19 positive or experiencing its symptoms.  That person may remain in the waiting area during the procedure.   Inpatient Visitation:   Visiting hours are 7 a.m. to 8 p.m. Inpatients will be allowed two visitors daily. The visitors may change each day during the patient's stay. No visitors under the age of 17. Any visitors under the age of 74 must be accompanied by an adult. The visitors must pass COVID-19 screening, use hand sanitizer when entering and exiting the patient's room and wear mask at all times, including the patient's room.  Masking is required regardless of vaccination status.

## 2020-09-18 ENCOUNTER — Other Ambulatory Visit: Payer: Medicare Other

## 2020-09-19 MED ORDER — CEFAZOLIN SODIUM-DEXTROSE 2-4 GM/100ML-% IV SOLN
2.0000 g | INTRAVENOUS | Status: AC
  Administered 2020-09-20: 2 g via INTRAVENOUS

## 2020-09-19 MED ORDER — ORAL CARE MOUTH RINSE: 15.0000 mL | Freq: Once | OROMUCOSAL | Status: DC

## 2020-09-19 MED ORDER — CHLORHEXIDINE GLUCONATE 0.12 % MT SOLN: 15.0000 mL | Freq: Once | OROMUCOSAL | Status: DC

## 2020-09-19 MED ORDER — LACTATED RINGERS IV SOLN: INTRAVENOUS | Status: DC

## 2020-09-19 NOTE — Progress Notes (Signed)
Pharmacy Antibiotic Note  JOVON WINTERHALTER is a 60 y.o. male admitted on (Not on file) with surgical prophylaxis.  Pharmacy has been consulted for Cefazolin dosing.  TBW = 104.8 kg   Plan: Cefazolin 2 gm IV X 1 60 min pre-op ordered for 4/27 @ 0500.      No data recorded.  Recent Labs  Lab 09/13/20 1143  WBC 10.0  CREATININE 0.80    Estimated Creatinine Clearance: 120 mL/min (by C-G formula based on SCr of 0.8 mg/dL).    Allergies  Allergen Reactions  . Aspirin Anaphylaxis  . Ace Inhibitors Cough  . Atorvastatin     Other reaction(s): Muscle Pain  . Rosuvastatin Other (See Comments)    Skin dryness, memory issues.    Antimicrobials this admission:   >>    >>   Dose adjustments this admission:   Microbiology results:  BCx:   UCx:    Sputum:    MRSA PCR:   Thank you for allowing pharmacy to be a part of this patient's care.  Maris Bena D 09/19/2020 11:22 PM

## 2020-09-20 ENCOUNTER — Ambulatory Visit
Admission: RE | Admit: 2020-09-20 | Discharge: 2020-09-20 | Disposition: A | Discharge status: Home / Self Care | Payer: Medicare Other | Attending: Neurosurgery | Admitting: Neurosurgery

## 2020-09-20 ENCOUNTER — Encounter
Admission: RE | Disposition: A | Discharge status: Home / Self Care | Payer: Self-pay | Source: Home / Self Care | Attending: Neurosurgery

## 2020-09-20 ENCOUNTER — Ambulatory Visit: Payer: Medicare Other

## 2020-09-20 ENCOUNTER — Other Ambulatory Visit: Payer: Self-pay

## 2020-09-20 ENCOUNTER — Encounter: Payer: Self-pay | Admitting: Neurosurgery

## 2020-09-20 DIAGNOSIS — M50123 Cervical disc disorder at C6-C7 level with radiculopathy: Secondary | ICD-10-CM | POA: Insufficient documentation

## 2020-09-20 DIAGNOSIS — Z79899 Other long term (current) drug therapy: Secondary | ICD-10-CM | POA: Insufficient documentation

## 2020-09-20 DIAGNOSIS — Z87891 Personal history of nicotine dependence: Secondary | ICD-10-CM | POA: Diagnosis not present

## 2020-09-20 DIAGNOSIS — Z419 Encounter for procedure for purposes other than remedying health state, unspecified: Secondary | ICD-10-CM

## 2020-09-20 HISTORY — PX: ANTERIOR CERVICAL DECOMP/DISCECTOMY FUSION: SHX1161

## 2020-09-20 LAB — ABO/RH: ABO/RH(D): O POS

## 2020-09-20 SURGERY — ANTERIOR CERVICAL DECOMPRESSION/DISCECTOMY FUSION 1 LEVEL
Anesthesia: General

## 2020-09-20 MED ORDER — VASOPRESSIN 20 UNIT/ML IV SOLN
INTRAVENOUS | Status: DC | PRN
  Administered 2020-09-20 (×5): 2 [IU] via INTRAVENOUS

## 2020-09-20 MED ORDER — PROPOFOL 10 MG/ML IV BOLUS
INTRAVENOUS | Status: AC
  Filled 2020-09-20: qty 40

## 2020-09-20 MED ORDER — FENTANYL CITRATE (PF) 100 MCG/2ML IJ SOLN
25.0000 ug | INTRAMUSCULAR | Status: DC | PRN
  Administered 2020-09-20 (×2): 25 ug via INTRAVENOUS

## 2020-09-20 MED ORDER — METHOCARBAMOL 500 MG PO TABS: 500.0000 mg | ORAL_TABLET | Freq: Four times a day (QID) | ORAL | 0 refills | Status: DC | PRN

## 2020-09-20 MED ORDER — SODIUM CHLORIDE 0.9 % IV SOLN
INTRAVENOUS | Status: DC | PRN
  Administered 2020-09-20: 30 ug/min via INTRAVENOUS

## 2020-09-20 MED ORDER — BUPIVACAINE-EPINEPHRINE (PF) 0.5% -1:200000 IJ SOLN
INTRAMUSCULAR | Status: DC | PRN
  Administered 2020-09-20: 8 mL

## 2020-09-20 MED ORDER — PROPOFOL 10 MG/ML IV BOLUS
INTRAVENOUS | Status: DC | PRN
  Administered 2020-09-20: 150 mg via INTRAVENOUS
  Administered 2020-09-20: 50 mg via INTRAVENOUS

## 2020-09-20 MED ORDER — MIDAZOLAM HCL 2 MG/2ML IJ SOLN
INTRAMUSCULAR | Status: AC
  Filled 2020-09-20: qty 2

## 2020-09-20 MED ORDER — PHENYLEPHRINE HCL (PRESSORS) 10 MG/ML IV SOLN
0.1000 mg | Freq: Once | INTRAVENOUS | Status: AC
  Administered 2020-09-20: 0.01 mg via INTRAVENOUS

## 2020-09-20 MED ORDER — SUCCINYLCHOLINE CHLORIDE 20 MG/ML IJ SOLN
INTRAMUSCULAR | Status: DC | PRN
  Administered 2020-09-20: 120 mg via INTRAVENOUS

## 2020-09-20 MED ORDER — DEXMEDETOMIDINE (PRECEDEX) IN NS 20 MCG/5ML (4 MCG/ML) IV SYRINGE
PREFILLED_SYRINGE | INTRAVENOUS | Status: AC
  Filled 2020-09-20: qty 5

## 2020-09-20 MED ORDER — DEXAMETHASONE SODIUM PHOSPHATE 10 MG/ML IJ SOLN
INTRAMUSCULAR | Status: DC | PRN
  Administered 2020-09-20: 10 mg via INTRAVENOUS

## 2020-09-20 MED ORDER — GLYCOPYRROLATE 0.2 MG/ML IJ SOLN
INTRAMUSCULAR | Status: AC
  Filled 2020-09-20: qty 1

## 2020-09-20 MED ORDER — SEVOFLURANE IN SOLN
RESPIRATORY_TRACT | Status: AC
  Filled 2020-09-20: qty 250

## 2020-09-20 MED ORDER — SUCCINYLCHOLINE CHLORIDE 200 MG/10ML IV SOSY
PREFILLED_SYRINGE | INTRAVENOUS | Status: AC
  Filled 2020-09-20: qty 10

## 2020-09-20 MED ORDER — LIDOCAINE HCL (CARDIAC) PF 100 MG/5ML IV SOSY
PREFILLED_SYRINGE | INTRAVENOUS | Status: DC | PRN
  Administered 2020-09-20: 80 mg via INTRAVENOUS

## 2020-09-20 MED ORDER — REMIFENTANIL HCL 1 MG IV SOLR
INTRAVENOUS | Status: AC
  Filled 2020-09-20: qty 1000

## 2020-09-20 MED ORDER — GLYCOPYRROLATE 0.2 MG/ML IJ SOLN
INTRAMUSCULAR | Status: DC | PRN
  Administered 2020-09-20: .2 mg via INTRAVENOUS

## 2020-09-20 MED ORDER — THROMBIN 5000 UNITS EX SOLR
CUTANEOUS | Status: DC | PRN
  Administered 2020-09-20: 5000 [IU] via TOPICAL

## 2020-09-20 MED ORDER — CEFAZOLIN SODIUM-DEXTROSE 2-4 GM/100ML-% IV SOLN
INTRAVENOUS | Status: AC
  Filled 2020-09-20: qty 100

## 2020-09-20 MED ORDER — EPHEDRINE SULFATE 50 MG/ML IJ SOLN
INTRAMUSCULAR | Status: DC | PRN
  Administered 2020-09-20 (×4): 5 mg via INTRAVENOUS

## 2020-09-20 MED ORDER — FENTANYL CITRATE (PF) 100 MCG/2ML IJ SOLN
INTRAMUSCULAR | Status: DC | PRN
  Administered 2020-09-20: 50 ug via INTRAVENOUS

## 2020-09-20 MED ORDER — FENTANYL CITRATE (PF) 100 MCG/2ML IJ SOLN
INTRAMUSCULAR | Status: AC
  Administered 2020-09-20: 25 ug via INTRAVENOUS
  Filled 2020-09-20: qty 2

## 2020-09-20 MED ORDER — OXYCODONE HCL 5 MG PO TABS
5.0000 mg | ORAL_TABLET | Freq: Once | ORAL | Status: AC
  Administered 2020-09-20: 5 mg via ORAL

## 2020-09-20 MED ORDER — CHLORHEXIDINE GLUCONATE 0.12 % MT SOLN
OROMUCOSAL | Status: AC
  Filled 2020-09-20: qty 15

## 2020-09-20 MED ORDER — OXYCODONE HCL 5 MG PO TABS
ORAL_TABLET | ORAL | Status: AC
  Filled 2020-09-20: qty 1

## 2020-09-20 MED ORDER — REMIFENTANIL HCL 1 MG IV SOLR
INTRAVENOUS | Status: DC | PRN
  Administered 2020-09-20: .15 ug/kg/min via INTRAVENOUS

## 2020-09-20 MED ORDER — OXYCODONE HCL 5 MG PO TABS: 5.0000 mg | ORAL_TABLET | Freq: Four times a day (QID) | ORAL | 0 refills | Status: AC | PRN

## 2020-09-20 MED ORDER — ONDANSETRON HCL 4 MG/2ML IJ SOLN
INTRAMUSCULAR | Status: DC | PRN
  Administered 2020-09-20 (×2): 4 mg via INTRAVENOUS

## 2020-09-20 MED ORDER — ACETAMINOPHEN 10 MG/ML IV SOLN
INTRAVENOUS | Status: DC | PRN
  Administered 2020-09-20: 1000 mg via INTRAVENOUS

## 2020-09-20 MED ORDER — SUGAMMADEX SODIUM 500 MG/5ML IV SOLN
INTRAVENOUS | Status: AC
  Filled 2020-09-20: qty 5

## 2020-09-20 MED ORDER — FENTANYL CITRATE (PF) 100 MCG/2ML IJ SOLN
INTRAMUSCULAR | Status: AC
  Filled 2020-09-20: qty 2

## 2020-09-20 MED ORDER — LIDOCAINE HCL (PF) 2 % IJ SOLN
INTRAMUSCULAR | Status: AC
  Filled 2020-09-20: qty 5

## 2020-09-20 MED ORDER — PROPOFOL 1000 MG/100ML IV EMUL
INTRAVENOUS | Status: AC
  Filled 2020-09-20: qty 100

## 2020-09-20 MED ORDER — PHENYLEPHRINE HCL (PRESSORS) 10 MG/ML IV SOLN
INTRAVENOUS | Status: DC | PRN
  Administered 2020-09-20 (×2): 100 ug via INTRAVENOUS
  Administered 2020-09-20: 700 ug via INTRAVENOUS

## 2020-09-20 MED ORDER — ACETAMINOPHEN 10 MG/ML IV SOLN
INTRAVENOUS | Status: AC
  Filled 2020-09-20: qty 100

## 2020-09-20 MED ORDER — ONDANSETRON HCL 4 MG/2ML IJ SOLN
INTRAMUSCULAR | Status: AC
  Filled 2020-09-20: qty 2

## 2020-09-20 MED ORDER — ROCURONIUM BROMIDE 10 MG/ML (PF) SYRINGE
PREFILLED_SYRINGE | INTRAVENOUS | Status: AC
  Filled 2020-09-20: qty 10

## 2020-09-20 MED ORDER — MIDAZOLAM HCL 2 MG/2ML IJ SOLN
INTRAMUSCULAR | Status: DC | PRN
  Administered 2020-09-20: 2 mg via INTRAVENOUS

## 2020-09-20 MED ORDER — ONDANSETRON HCL 4 MG/2ML IJ SOLN
4.0000 mg | Freq: Once | INTRAMUSCULAR | Status: DC | PRN
Start: 2020-09-20 — End: 2020-09-20

## 2020-09-20 MED ORDER — SODIUM CHLORIDE FLUSH 0.9 % IV SOLN
INTRAVENOUS | Status: AC
  Filled 2020-09-20: qty 10

## 2020-09-20 MED ORDER — DEXAMETHASONE SODIUM PHOSPHATE 10 MG/ML IJ SOLN
INTRAMUSCULAR | Status: AC
  Filled 2020-09-20: qty 1

## 2020-09-20 SURGICAL SUPPLY — 60 items
ADH SKN CLS APL DERMABOND .7 (GAUZE/BANDAGES/DRESSINGS) ×1
AGENT HMST MTR 8 SURGIFLO (HEMOSTASIS) ×1
APL PRP STRL LF DISP 70% ISPRP (MISCELLANEOUS) ×2
BASKET BONE COLLECTION (BASKET) IMPLANT
BULB RESERV EVAC DRAIN JP 100C (MISCELLANEOUS) IMPLANT
BUR NEURO DRILL SOFT 3.0X3.8M (BURR) ×2 IMPLANT
CHLORAPREP W/TINT 26 (MISCELLANEOUS) ×4 IMPLANT
COUNTER NEEDLE 20/40 LG (NEEDLE) ×2 IMPLANT
COVER WAND RF STERILE (DRAPES) ×2 IMPLANT
CUP MEDICINE 2OZ PLAST GRAD ST (MISCELLANEOUS) ×2 IMPLANT
DERMABOND ADVANCED (GAUZE/BANDAGES/DRESSINGS) ×1
DERMABOND ADVANCED .7 DNX12 (GAUZE/BANDAGES/DRESSINGS) ×1 IMPLANT
DRAIN CHANNEL JP 10F RND 20C F (MISCELLANEOUS) IMPLANT
DRAPE C ARM PK CFD 31 SPINE (DRAPES) ×2 IMPLANT
DRAPE LAPAROTOMY 77X122 PED (DRAPES) ×2 IMPLANT
DRAPE MICROSCOPE SPINE 48X150 (DRAPES) ×2 IMPLANT
DRAPE SURG 17X11 SM STRL (DRAPES) ×8 IMPLANT
ELECT CAUTERY BLADE TIP 2.5 (TIP) ×2
ELECT REM PT RETURN 9FT ADLT (ELECTROSURGICAL) ×2
ELECTRODE CAUTERY BLDE TIP 2.5 (TIP) ×1 IMPLANT
ELECTRODE REM PT RTRN 9FT ADLT (ELECTROSURGICAL) ×1 IMPLANT
FEE INTRAOP MONITOR IMPULS NCS (MISCELLANEOUS) IMPLANT
GLOVE SURG SYN 8.5  E (GLOVE) ×3
GLOVE SURG SYN 8.5 E (GLOVE) ×3 IMPLANT
GLOVE SURG SYN 8.5 PF PI (GLOVE) ×3 IMPLANT
GOWN SRG XL LVL 3 NONREINFORCE (GOWNS) ×1 IMPLANT
GOWN STRL NON-REIN TWL XL LVL3 (GOWNS) ×2
GOWN STRL REUS W/ TWL XL LVL3 (GOWN DISPOSABLE) ×1 IMPLANT
GOWN STRL REUS W/TWL XL LVL3 (GOWN DISPOSABLE) ×2
GRADUATE 1200CC STRL 31836 (MISCELLANEOUS) ×2 IMPLANT
INTRAOP MONITOR FEE IMPULS NCS (MISCELLANEOUS)
INTRAOP MONITOR FEE IMPULSE (MISCELLANEOUS)
KIT TURNOVER KIT A (KITS) ×2 IMPLANT
MANIFOLD NEPTUNE II (INSTRUMENTS) ×2 IMPLANT
MARKER SKIN DUAL TIP RULER LAB (MISCELLANEOUS) ×4 IMPLANT
NDL SAFETY ECLIPSE 18X1.5 (NEEDLE) ×1 IMPLANT
NEEDLE HYPO 18GX1.5 SHARP (NEEDLE) ×2
NEEDLE HYPO 22GX1.5 SAFETY (NEEDLE) ×2 IMPLANT
NS IRRIG 1000ML POUR BTL (IV SOLUTION) ×2 IMPLANT
PACK LAMINECTOMY NEURO (CUSTOM PROCEDURE TRAY) ×2 IMPLANT
PAD ARMBOARD 7.5X6 YLW CONV (MISCELLANEOUS) ×2 IMPLANT
PIN CASPAR 14 (PIN) ×1 IMPLANT
PIN CASPAR 14MM (PIN) ×2 IMPLANT
PLATE ANT CERV XTEND 1 LV 12 (Plate) ×1 IMPLANT
PUTTY DBX 1CC (Putty) ×2 IMPLANT
PUTTY DBX 1CC DEPUY (Putty) IMPLANT
SCREW VAR SELF DRILL XD 18X4.2 (Screw) IMPLANT
SCREW VAR SELF DRILL XD 4.2 (Screw) ×8 IMPLANT
SPACER HEDRON C 12X14X8 7D (Spacer) ×1 IMPLANT
SPOGE SURGIFLO 8M (HEMOSTASIS) ×1
SPONGE KITTNER 5P (MISCELLANEOUS) ×2 IMPLANT
SPONGE SURGIFLO 8M (HEMOSTASIS) ×1 IMPLANT
STAPLER SKIN PROX 35W (STAPLE) IMPLANT
SUT V-LOC 90 ABS DVC 3-0 CL (SUTURE) ×2 IMPLANT
SUT VIC AB 3-0 SH 8-18 (SUTURE) ×2 IMPLANT
SYR 30ML LL (SYRINGE) ×2 IMPLANT
TAPE CLOTH 3X10 WHT NS LF (GAUZE/BANDAGES/DRESSINGS) ×2 IMPLANT
TOWEL OR 17X26 4PK STRL BLUE (TOWEL DISPOSABLE) ×6 IMPLANT
TRAY FOLEY MTR SLVR 16FR STAT (SET/KITS/TRAYS/PACK) IMPLANT
TUBING CONNECTING 10 (TUBING) ×2 IMPLANT

## 2020-09-20 NOTE — Transfer of Care (Signed)
Immediate Anesthesia Transfer of Care Note  Patient: Darrell Schmidt  Procedure(s) Performed: ANTERIOR CERVICAL DECOMPRESSION/DISCECTOMY FUSION 1 LEVEL C6-7 (N/A )  Patient Location: PACU  Anesthesia Type:General  Level of Consciousness: awake, drowsy and patient cooperative  Airway & Oxygen Therapy: Patient Spontanous Breathing  Post-op Assessment: Pt vitals reviewed, map=60, neo given, pt receiving fluid bolus, awake, positive communication w/staff, moving all ext4, MDA aware.   Post vital signs: Reviewed and stable  Last Vitals:  Vitals Value Taken Time  BP 76/53 09/20/20 0920  Temp    Pulse 93 09/20/20 0921  Resp 13 09/20/20 0921  SpO2 100 % 09/20/20 0921  Vitals shown include unvalidated device data.  Last Pain:  Vitals:   09/20/20 0622  TempSrc: Oral  PainSc: 4          Complications: No complications documented.

## 2020-09-20 NOTE — Anesthesia Procedure Notes (Addendum)
Procedure Name: Intubation Performed by: Fletcher-Harrison, Rylei Masella, CRNA Pre-anesthesia Checklist: Patient identified, Emergency Drugs available, Suction available and Patient being monitored Patient Re-evaluated:Patient Re-evaluated prior to induction Oxygen Delivery Method: Circle system utilized Preoxygenation: Pre-oxygenation with 100% oxygen Induction Type: IV induction Ventilation: Mask ventilation without difficulty Laryngoscope Size: McGraph and 3 Grade View: Grade I Tube type: Oral Tube size: 7.5 mm Number of attempts: 1 Airway Equipment and Method: Stylet and Oral airway Placement Confirmation: ETT inserted through vocal cords under direct vision,  positive ETCO2,  breath sounds checked- equal and bilateral and CO2 detector Secured at: 21 cm Tube secured with: Tape Dental Injury: Teeth and Oropharynx as per pre-operative assessment        

## 2020-09-20 NOTE — Discharge Instructions (Addendum)
Your surgeon has performed an operation on your cervical spine (neck) to relieve pressure on the spinal cord and/or nerves. This involved making an incision in the front of your neck and removing one or more of the discs that support your spine. Next, a small piece of bone, a titanium plate, and screws were used to fuse two or more of the vertebrae (bones) together.  The following are instructions to help in your recovery once you have been discharged from the hospital. Even if you feel well, it is important that you follow these activity guidelines. If you do not let your neck heal properly from the surgery, you can increase the chance of return of your symptoms and other complications.  * Do not take anti-inflammatory medications for 3 months after surgery (naproxen [Aleve], ibuprofen [Advil, Motrin], celecoxib [Celebrex], etc.). These medications can prevent your bones from healing properly.  Activity    No bending, lifting, or twisting ("BLT"). Avoid lifting objects heavier than 10 pounds (gallon milk jug).  Where possible, avoid household activities that involve lifting, bending, reaching, pushing, or pulling such as laundry, vacuuming, grocery shopping, and childcare. Try to arrange for help from friends and family for these activities while your back heals.  Increase physical activity slowly as tolerated.  Taking short walks is encouraged, but avoid strenuous exercise. Do not jog, run, bicycle, lift weights, or participate in any other exercises unless specifically allowed by your doctor.  Talk to your doctor before resuming sexual activity.  You should not drive until cleared by your doctor.  Until released by your doctor, you should not return to work or school.  You should rest at home and let your body heal.   You may shower one day after your surgery.  After showering, lightly dab your incision dry. Do not take a tub bath or go swimming until approved by your doctor at your follow-up  appointment.  If your doctor ordered a cervical collar (neck brace) for you, you should wear it whenever you are out of bed. You may remove it when lying down or sleeping, but you should wear it at all other times. Not all neck surgeries require a cervical collar.  If you smoke, we strongly recommend that you quit.  Smoking has been proven to interfere with normal bone healing and will dramatically reduce the success rate of your surgery. Please contact [facility] (800-QUIT-NOW) and use the resources at www.[facility].com for assistance in stopping smoking.  Surgical Incision   If you have a dressing on your incision, you may remove it two days after your surgery. Keep your incision area clean and dry.  If you have staples or stitches on your incision, you should have a follow up scheduled for removal. If you do not have staples or stitches, you will have steri-strips (small pieces of surgical tape) or Dermabond glue. The steri-strips/glue should begin to peel away within about a week (it is fine if the steri-strips fall off before then). If the strips are still in place one week after your surgery, you may gently remove them.  Diet           You may return to your usual diet. However, you may experience discomfort when swallowing in the first month after your surgery. This is normal. You may find that softer foods are more comfortable for you to swallow. Be sure to stay hydrated.  When to Contact us  You may experience pain in your neck and/or pain between your shoulder  blades. This is normal and should improve in the next few weeks with the help of pain medication, muscle relaxers, and rest. Some patients report that a warm compress on the back of the neck or between the shoulder blades helps.  However, should you experience any of the following, contact us immediately: . New numbness or weakness . Pain that is progressively getting worse, and is not relieved by your pain medication, muscle  relaxers, rest, and warm compresses . Bleeding, redness, swelling, pain, or drainage from surgical incision . Chills or flu-like symptoms . Fever greater than 101.0 F (38.3 C) . Inability to eat, drink fluids, or take medications . Problems with bowel or bladder functions . Difficulty breathing or shortness of breath . Warmth, tenderness, or swelling in your calf Contact Information . During office hours (Monday-Friday 9 am to 5 pm), please call your physician at [phone] and ask for Sharlot Gowda . After hours and weekends, please call [phone] and speak with the answering service, who will contact the doctor on call.  If that fails, call the [facility] Operator at [phone] and ask for the Neurosurgery Resident On Call  . For a life-threatening emergency, call 911  AMBULATORY SURGERY  DISCHARGE INSTRUCTIONS   1) The drugs that you were given will stay in your system until tomorrow so for the next 24 hours you should not:  A) Drive an automobile B) Make any legal decisions C) Drink any alcoholic beverage   2) You may resume regular meals tomorrow.  Today it is better to start with liquids and gradually work up to solid foods.  You may eat anything you prefer, but it is better to start with liquids, then soup and crackers, and gradually work up to solid foods.   3) Please notify your doctor immediately if you have any unusual bleeding, trouble breathing, redness and pain at the surgery site, drainage, fever, or pain not relieved by medication.     Please contact your physician with any problems or Same Day Surgery at [phone], Monday through Friday 6 am to 4 pm, or [place] [facility] number at [phone].

## 2020-09-20 NOTE — Op Note (Signed)
Indications: Darrell Schmidt is suffering from cervical radiculopathy and right arm weakness. he failed conservative management, and elected to proceed with surgery.  Findings: large R C6-7 disc herniation  Preoperative Diagnosis: Cervical radiculopathy, Right arm weakness Postoperative Diagnosis: same   EBL: 20 ml IVF: 600 ml Drains: none Disposition: Extubated and Stable to PACU Complications: none  No foley catheter was placed.   Preoperative Note:   Risks of surgery discussed include: infection, bleeding, stroke, coma, death, paralysis, CSF leak, nerve/spinal cord injury, numbness, tingling, weakness, complex regional pain syndrome, recurrent stenosis and/or disc herniation, vascular injury, development of instability, neck/back pain, need for further surgery, persistent symptoms, development of deformity, and the risks of anesthesia. The patient understood these risks and agreed to proceed.  Operative Note:   Procedure:  1) Anterior cervical diskectomy and fusion at C6-7 2) Anterior cervical instrumentation at C6-7 with Globus Xtend 3) Insertion of biomechanical device at C6-7   Procedure: After obtaining informed consent, the patient taken to the operating room, placed in supine position, general anesthesia induced.  The patient had a small shoulder roll placed behind their shoulders.  The patient received preop antibiotics and IV Decadron.  The patient had a neck incision outlined, was prepped and draped in usual sterile fashion. The incision was injected with local anesthetic.   An incision was opened, dissection taken down medial to the carotid artery and jugular vein, lateral to the trachea and esophagus.  The prevertebral fascia was identified, and a localizing x-ray demonstrated the correct level.  The longus colli were dissected laterally, and self-retaining retractors placed to open the operative field. The microscope was then brought into the field.  With this complete,  distractor pins were placed in the vertebral bodies of C6 and C7. The distractor was placed, and the annulus at C6/7 was opened using a bovie.  Curettes and pituitary rongeurs used to remove the majority of disk, then the drill was used to remove the posterior osteophyte and begin the foraminotomies. The nerve hook was used to elevate the posterior longitudinal ligament, which was then removed with Kerrison rongeurs. Bilateral foraminotomies were performed. The microblunt nerve hook could be passed out the foramen bilaterally.   Meticulous hemostasis was obtained.  A biomechanical device (Globus Hedron C 8 mm height x 14 mm width by 12 mm depth) was placed at C6/7. The device had been filled with allograft for aid in arthrodesis.  The caspar distractor was removed, and bone wax used for hemostasis. A 12 mm Globus Xtend plate was chosen.  Two screws placed in each vertebral body, respectively making sure the screws were behind the locking mechanism.  Final AP and lateral radiographs were taken.   With everything in good position, the wound was irrigated copiously with bacitracin-containing solution and meticulous hemostasis obtained.  Wound was closed in 2 layers using interrupted inverted 3-0 Vicryl sutures.  The wound was dressed with dermabond, the head of bed at 30 degrees, taken to recovery room in stable condition.  No new postop neurological deficits were identified.  Sponge and pattie counts were correct at the end of the procedure.   I performed the entire procedure with the assistance of Anabel Halon PA as an Designer, television/film set.  Venetia Night MD

## 2020-09-20 NOTE — Discharge Summary (Signed)
Physician Discharge Summary  Patient ID: Darrell Schmidt MRN: [id number] DOB/AGE: Apr 03, 1961 60 y.o.  Admit date: 09/20/2020 Discharge date: 09/20/2020  Admission Diagnoses: cervical radiculopathy, R arm weakness  Discharge Diagnoses:  Active Problems:   * No active hospital problems. *   Discharged Condition: good  Hospital Course: Darrell Schmidt was admitted to the OR for surgical intervention.  He tolerated the procedure well and was discharged in stable condition.  Consults: None  Significant Diagnostic Studies: radiology: X-Ray: confirmation of C6-7 ACDF  Treatments: surgery: C6-7 ACDF  Discharge Exam: Blood pressure 134/82, pulse 68, temperature 98.2 F (36.8 C), temperature source Oral, resp. rate 18, height _0  (1.778 m), weight 102.1 kg, SpO2 99 %. General appearance: alert and cooperative  CNI Neck soft MAEW  Disposition: Discharge disposition: 01-Home or Self Care        Allergies as of 09/20/2020      Reactions   Aspirin Anaphylaxis   Ace Inhibitors Cough   Atorvastatin    Other reaction(s): Muscle Pain   Rosuvastatin Other (See Comments)   Skin dryness, memory issues.      Medication List    STOP taking these medications   ketorolac 10 MG tablet Commonly known as: TORADOL     TAKE these medications   amLODipine 5 MG tablet Commonly known as: NORVASC Take 1 tablet by mouth daily at 12 noon.   amoxicillin 500 MG capsule Commonly known as: AMOXIL Take 500 mg by mouth 3 (three) times daily.   DULoxetine 20 MG capsule Commonly known as: CYMBALTA Take 20 mg by mouth 2 (two) times daily.   gabapentin 300 MG capsule Commonly known as: NEURONTIN Take 300-600 mg by mouth See admin instructions. Tale 300 mg twice a day and 600 mg at bedtime   HYDROcodone-acetaminophen 5-325 MG tablet Commonly known as: NORCO/VICODIN Take 1 tablet by mouth 3 (three) times daily as needed for pain.   methocarbamol 500 MG tablet Commonly known as:  Robaxin Take 1 tablet (500 mg total) by mouth every 6 (six) hours as needed for muscle spasms.   Narcan 4 MG/0.1ML Liqd nasal spray kit Generic drug: naloxone Place 1 spray into the nose daily as needed for opioid reversal.   oxyCODONE 5 MG immediate release tablet Commonly known as: Roxicodone Take 1 tablet (5 mg total) by mouth every 6 (six) hours as needed for up to 5 days for moderate pain.   pantoprazole 40 MG tablet Commonly known as: PROTONIX Take 40 mg by mouth daily at 4 PM.   traMADol 50 MG tablet Commonly known as: ULTRAM Take 50 mg by mouth 3 (three) times daily as needed for pain.   traZODone 100 MG tablet Commonly known as: DESYREL Take 50 mg by mouth at bedtime.       Follow-up Information    Meade Maw, MD In 2 weeks.   Specialty: Neurosurgery Why: already scheduled Contact information: [place] [address] [phone]               Signed: Meade Maw 09/20/2020, 9:04 AM

## 2020-09-20 NOTE — Anesthesia Preprocedure Evaluation (Signed)
Anesthesia Evaluation  Patient identified by MRN, date of birth, ID band Patient awake    Reviewed: Allergy & Precautions, H&P , NPO status , Patient's Chart, lab work & pertinent test results, reviewed documented beta blocker date and time   Airway Mallampati: II  TM Distance: >3 FB Neck ROM: full    Dental  (+) Teeth Intact   Pulmonary sleep apnea and Continuous Positive Airway Pressure Ventilation , former smoker,    Pulmonary exam normal        Cardiovascular Exercise Tolerance: Good hypertension, On Medications negative cardio ROS Normal cardiovascular exam Rhythm:regular Rate:Normal     Neuro/Psych PSYCHIATRIC DISORDERS Depression negative neurological ROS     GI/Hepatic negative GI ROS, Neg liver ROS,   Endo/Other  negative endocrine ROS  Renal/GU negative Renal ROS  negative genitourinary   Musculoskeletal   Abdominal   Peds  Hematology negative hematology ROS (+)   Anesthesia Other Findings Past Medical History: No date: Depression No date: History of kidney stones No date: Hypertension No date: Sleep apnea     Comment:  some apnea, but is waiting on Cpap machine  Past Surgical History: 2015: BACK SURGERY     Comment:  lumbar Discectomy  2016: BACK SURGERY     Comment:  Lumbar Discectomy  1980: NOSE SURGERY 1980: nose surgery  2007: TOE SURGERY; Right BMI    Body Mass Index: 32.28 kg/m     Reproductive/Obstetrics negative OB ROS                             Anesthesia Physical Anesthesia Plan  ASA: II  Anesthesia Plan: General ETT   Post-op Pain Management:    Induction:   PONV Risk Score and Plan: 3  Airway Management Planned:   Additional Equipment:   Intra-op Plan:   Post-operative Plan:   Informed Consent: I have reviewed the patients History and Physical, chart, labs and discussed the procedure including the risks, benefits and alternatives for  the proposed anesthesia with the patient or authorized representative who has indicated his/her understanding and acceptance.     Dental Advisory Given  Plan Discussed with: CRNA  Anesthesia Plan Comments:         Anesthesia Quick Evaluation

## 2020-09-20 NOTE — H&P (Signed)
I have reviewed and confirmed my history and physical from 09/01/20 with no additions or changes. Plan for C6-7 ACDF.  Risks and benefits reviewed.  Heart sounds normal no MRG. Chest Clear to Auscultation Bilaterally.

## 2020-09-21 NOTE — Anesthesia Postprocedure Evaluation (Signed)
Anesthesia Post Note  Patient: Darrell Schmidt  Procedure(s) Performed: ANTERIOR CERVICAL DECOMPRESSION/DISCECTOMY FUSION 1 LEVEL C6-7 (N/A )  Patient location during evaluation: PACU Anesthesia Type: General Level of consciousness: awake and alert Pain management: pain level controlled Vital Signs Assessment: post-procedure vital signs reviewed and stable Respiratory status: spontaneous breathing, nonlabored ventilation, respiratory function stable and patient connected to nasal cannula oxygen Cardiovascular status: blood pressure returned to baseline and stable Postop Assessment: no apparent nausea or vomiting Anesthetic complications: no   No complications documented.   Last Vitals:  Vitals:   09/20/20 1058 09/20/20 1111  BP: 129/68   Pulse: 92 94  Resp: (!) 21 18  Temp:  37 C  SpO2: 94% 97%    Last Pain:  Vitals:   09/21/20 1218  TempSrc:   PainSc: 2                  Yevette Edwards

## 2020-09-22 ENCOUNTER — Encounter: Payer: Self-pay | Admitting: Neurosurgery

## 2020-10-19 DIAGNOSIS — M4322 Fusion of spine, cervical region: Secondary | ICD-10-CM | POA: Diagnosis not present

## 2020-10-19 DIAGNOSIS — Z981 Arthrodesis status: Secondary | ICD-10-CM | POA: Diagnosis not present

## 2020-10-27 ENCOUNTER — Other Ambulatory Visit (HOSPITAL_COMMUNITY): Payer: Self-pay | Admitting: Neurosurgery

## 2020-10-27 ENCOUNTER — Other Ambulatory Visit: Payer: Self-pay | Admitting: Neurosurgery

## 2020-10-27 DIAGNOSIS — Z981 Arthrodesis status: Secondary | ICD-10-CM

## 2020-10-31 DIAGNOSIS — Z0000 Encounter for general adult medical examination without abnormal findings: Secondary | ICD-10-CM | POA: Diagnosis not present

## 2020-10-31 DIAGNOSIS — M519 Unspecified thoracic, thoracolumbar and lumbosacral intervertebral disc disorder: Secondary | ICD-10-CM | POA: Diagnosis not present

## 2020-10-31 DIAGNOSIS — Z125 Encounter for screening for malignant neoplasm of prostate: Secondary | ICD-10-CM | POA: Diagnosis not present

## 2020-11-20 ENCOUNTER — Other Ambulatory Visit: Payer: Self-pay

## 2020-11-20 ENCOUNTER — Ambulatory Visit
Admission: RE | Admit: 2020-11-20 | Discharge: 2020-11-20 | Disposition: A | Discharge status: Home / Self Care | Payer: Medicare Other | Source: Ambulatory Visit | Attending: Neurosurgery | Admitting: Neurosurgery

## 2020-11-20 DIAGNOSIS — M545 Low back pain, unspecified: Secondary | ICD-10-CM | POA: Diagnosis not present

## 2020-11-20 DIAGNOSIS — F411 Generalized anxiety disorder: Secondary | ICD-10-CM | POA: Diagnosis not present

## 2020-11-20 DIAGNOSIS — Z981 Arthrodesis status: Secondary | ICD-10-CM | POA: Insufficient documentation

## 2020-11-20 DIAGNOSIS — F331 Major depressive disorder, recurrent, moderate: Secondary | ICD-10-CM | POA: Diagnosis not present

## 2020-11-20 DIAGNOSIS — G4700 Insomnia, unspecified: Secondary | ICD-10-CM | POA: Diagnosis not present

## 2020-12-05 DIAGNOSIS — U071 COVID-19: Secondary | ICD-10-CM | POA: Diagnosis not present

## 2020-12-05 DIAGNOSIS — Z7189 Other specified counseling: Secondary | ICD-10-CM | POA: Diagnosis not present

## 2020-12-28 DIAGNOSIS — M4322 Fusion of spine, cervical region: Secondary | ICD-10-CM | POA: Diagnosis not present

## 2020-12-28 DIAGNOSIS — M47812 Spondylosis without myelopathy or radiculopathy, cervical region: Secondary | ICD-10-CM | POA: Diagnosis not present

## 2020-12-28 DIAGNOSIS — Z981 Arthrodesis status: Secondary | ICD-10-CM | POA: Diagnosis not present

## 2021-01-02 DIAGNOSIS — G4700 Insomnia, unspecified: Secondary | ICD-10-CM | POA: Diagnosis not present

## 2021-01-02 DIAGNOSIS — F331 Major depressive disorder, recurrent, moderate: Secondary | ICD-10-CM | POA: Diagnosis not present

## 2021-01-02 DIAGNOSIS — F411 Generalized anxiety disorder: Secondary | ICD-10-CM | POA: Diagnosis not present

## 2021-01-17 ENCOUNTER — Other Ambulatory Visit (INDEPENDENT_AMBULATORY_CARE_PROVIDER_SITE_OTHER): Payer: Self-pay | Admitting: Vascular Surgery

## 2021-01-17 DIAGNOSIS — I70213 Atherosclerosis of native arteries of extremities with intermittent claudication, bilateral legs: Secondary | ICD-10-CM | POA: Insufficient documentation

## 2021-01-17 DIAGNOSIS — I10 Essential (primary) hypertension: Secondary | ICD-10-CM | POA: Insufficient documentation

## 2021-01-17 DIAGNOSIS — I739 Peripheral vascular disease, unspecified: Secondary | ICD-10-CM

## 2021-01-17 DIAGNOSIS — R42 Dizziness and giddiness: Secondary | ICD-10-CM

## 2021-01-17 DIAGNOSIS — I6523 Occlusion and stenosis of bilateral carotid arteries: Secondary | ICD-10-CM | POA: Insufficient documentation

## 2021-01-17 DIAGNOSIS — K219 Gastro-esophageal reflux disease without esophagitis: Secondary | ICD-10-CM | POA: Insufficient documentation

## 2021-01-17 NOTE — Progress Notes (Signed)
MRN : [id number]  Darrell Schmidt is a 60 y.o. ([birth year]) male who presents with chief complaint of blockage in the carotid arteries.  History of Present Illness:  The patient is seen for evaluation of a syncopal episodes. The patient describes it as a light headedness and denies the "room spinning".  It lasted on the order of minutes and resolved completely.  There was no loss of consciousness.  There have several episodes but they are unpredictable and intermittent.  Patient denies any pattern or consistency.  There is no recent history of TIA symptoms or focal motor deficits. There is no prior documented CVA.  He has documented discrepancy of his blood pressures with the left arm being lower.  He denies hand pain or claudication no ulcers or fissures  The patient is taking enteric-coated aspirin 81 mg daily at the time.  There is no history of migraine headaches or prior diagnosis of ocular migraine. There is no history of seizures.  The patient denies claudication symptoms.  The does not have a history of coronary artery disease, no recent episodes of angina or shortness of breath. There is a history of hyperlipidemia which is being treated with a statin.    Carotid duplex shows minimal disease in the carotid arteries.  Both right and left vertebral arteries are antegrade. However, the left subclavian shows monophasic flow suggesting a lesion distal to the vertebral artery.   No outpatient medications have been marked as taking for the 01/18/21 encounter (Appointment) with Gilda Crease, Latina Craver, MD.    Past Medical History:  Diagnosis Date   Depression    History of kidney stones    Hypertension    Sleep apnea    some apnea, but is waiting on Cpap machine     Past Surgical History:  Procedure Laterality Date   ANTERIOR CERVICAL DECOMP/DISCECTOMY FUSION N/A 09/20/2020   Procedure: ANTERIOR CERVICAL DECOMPRESSION/DISCECTOMY FUSION 1 LEVEL C6-7;  Surgeon: Venetia Night,  MD;  Location: [facility] ORS;  Service: Neurosurgery;  Laterality: N/A;  1st case   BACK SURGERY  2015   lumbar Discectomy    BACK SURGERY  2016   Lumbar Discectomy    NOSE SURGERY  1980   nose surgery   1980   TOE SURGERY Right 2007    Social History Social History   Tobacco Use   Smoking status: Former    Types: Cigarettes    Quit date: 05/27/2009    Years since quitting: 11.6   Smokeless tobacco: Never  Vaping Use   Vaping Use: Every day   Devices: with some nicotine   Substance Use Topics   Alcohol use: Never   Drug use: Never    Family History No family history on file.  Allergies  Allergen Reactions   Aspirin Anaphylaxis   Ace Inhibitors Cough   Atorvastatin     Other reaction(s): Muscle Pain   Rosuvastatin Other (See Comments)    Skin dryness, memory issues.     REVIEW OF SYSTEMS (Negative unless checked)  Constitutional: [] Weight loss  [] Fever  [] Chills Cardiac: [] Chest pain   [] Chest pressure   [] Palpitations   [] Shortness of breath when laying flat   [] Shortness of breath with exertion. Vascular:  [] Pain in legs with walking   [] Pain in legs at rest  [] History of DVT   [] Phlebitis   [] Swelling in legs   [] Varicose veins   [] Non-healing ulcers Pulmonary:   [] Uses home oxygen   [] Productive cough   [] Hemoptysis   []   Wheeze  [] COPD   [] Asthma Neurologic:  [x] Dizziness   [] Seizures   [] History of stroke   [] History of TIA  [] Aphasia   [] Vissual changes   [] Weakness or numbness in arm   [] Weakness or numbness in leg Musculoskeletal:   [] Joint swelling   [x] Joint pain   [] Low back pain Hematologic:  [] Easy bruising  [] Easy bleeding   [] Hypercoagulable state   [] Anemic Gastrointestinal:  [] Diarrhea   [] Vomiting  [x] Gastroesophageal reflux/heartburn   [] Difficulty swallowing. Genitourinary:  [] Chronic kidney disease   [] Difficult urination  [] Frequent urination   [] Blood in urine Skin:  [] Rashes   [] Ulcers  Psychological:  [] History of anxiety   []  History of major  depression.  Physical Examination  There were no vitals filed for this visit. There is no height or weight on file to calculate BMI. Gen: WD/WN, NAD Head: [place]/AT, No temporalis wasting.  Ear/Nose/Throat: Hearing grossly intact, nares w/o erythema or drainage Eyes: PER, EOMI, sclera nonicteric.  Neck: Supple, no masses.  No bruit or JVD.  Pulmonary:  Good air movement, no audible wheezing, no use of accessory muscles.  Cardiac: RRR, normal S1, S2, no Murmurs. Vascular:   left hand is warm to touch no ulcers or fissures Vessel Right Left  Radial Palpable Not Palpable  Carotid Palpable Palpable  PT Palpable Palpable  Gastrointestinal: soft, non-distended. No guarding/no peritoneal signs.  Musculoskeletal: M/S 5/5 throughout.  No visible deformity.  Neurologic: CN 2-12 intact. Pain and light touch intact in extremities.  Symmetrical.  Speech is fluent. Motor exam as listed above. Psychiatric: Judgment intact, Mood & affect appropriate for pt's clinical situation. Dermatologic: No rashes or ulcers noted.  No changes consistent with cellulitis.   CBC Lab Results  Component Value Date   WBC 10.0 09/13/2020   HGB 13.5 09/13/2020   HCT 39.8 09/13/2020   MCV 90.5 09/13/2020   PLT 294 09/13/2020    BMET    Component Value Date/Time   NA 139 09/13/2020 1143   K 3.9 09/13/2020 1143   CL 103 09/13/2020 1143   CO2 27 09/13/2020 1143   GLUCOSE 127 (H) 09/13/2020 1143   BUN 16 09/13/2020 1143   CREATININE 0.80 09/13/2020 1143   CALCIUM 9.3 09/13/2020 1143   GFRNONAA >60 09/13/2020 1143   CrCl cannot be calculated (Patient's most recent lab result is older than the maximum 21 days allowed.).  COAG Lab Results  Component Value Date   INR 0.9 09/13/2020    Radiology No results found.   Assessment/Plan 1. Syncope, unspecified syncope type Recommend:  Given the patient's asymptomatic subcritical stenosis of the carotid arteries no further invasive testing or surgery at this  time.  His syncope is not consistent and his vertebral artery appears to be antegrade so I don't think intervention at this point is necessary.  The patient is not having any left arm symptoms and denies lifestyle issues.  Carotid duplex shows minimal disease in the carotid arteries.  Both right and left vertebral arteries are antegrade. However, the left subclavian shows monophasic flow suggesting a lesion distal to the vertebral artery.   Continue antiplatelet therapy as prescribed Continue management of CAD, HTN and Hyperlipidemia Healthy heart diet,  encouraged exercise at least 4 times per week  Follow up in 6 months with duplex ultrasound and physical exam   - VAS CAROTID; Future  2. Subclavian steal syndrome Recommend:  Given the patient's asymptomatic subcritical stenosis of the carotid arteries no further invasive testing or surgery at this  time.  His syncope is not consistent and his vertebral artery appears to be antegrade so I don't think intervention at this point is necessary.  The patient is not having any left arm symptoms and denies lifestyle issues.  Carotid duplex shows minimal disease in the carotid arteries.  Both right and left vertebral arteries are antegrade. However, the left subclavian shows monophasic flow suggesting a lesion distal to the vertebral artery.   Continue antiplatelet therapy as prescribed Continue management of CAD, HTN and Hyperlipidemia Healthy heart diet,  encouraged exercise at least 4 times per week  Follow up in 6 months with duplex ultrasound and physical exam   - VAS US CAROTID; Future  3. Bilateral carotid artery stenosis Recommend:  Given the patient's asymptomatic subcritical stenosis of the carotid arteries no further invasive testing or surgery at this time.  His syncope is not consistent and his vertebral artery appears to be antegrade so I don't think intervention at this point is necessary.  The patient is not having any left arm  symptoms and denies lifestyle issues.  Carotid duplex shows minimal disease in the carotid arteries.  Both right and left vertebral arteries are antegrade. However, the left subclavian shows monophasic flow suggesting a lesion distal to the vertebral artery.   Continue antiplatelet therapy as prescribed Continue management of CAD, HTN and Hyperlipidemia Healthy heart diet,  encouraged exercise at least 4 times per week  Follow up in 6 months with duplex ultrasound and physical exam   - VAS US CAROTID; Future  4. Essential hypertension Continue antihypertensive medications as already ordered, these medications have been reviewed and there are no changes at this time.   5. Gastroesophageal reflux disease without esophagitis Continue PPI as already ordered, this medication has been reviewed and there are no changes at this time.  Avoidence of caffeine and alcohol  Moderate elevation of the head of the bed    6. Hyperlipidemia, mixed Continue statin as ordered and reviewed, no changes at this time     [name], MD  01/17/2021 6:13 PM

## 2021-01-18 ENCOUNTER — Ambulatory Visit (INDEPENDENT_AMBULATORY_CARE_PROVIDER_SITE_OTHER): Payer: Medicare Other

## 2021-01-18 ENCOUNTER — Other Ambulatory Visit: Payer: Self-pay

## 2021-01-18 ENCOUNTER — Ambulatory Visit (INDEPENDENT_AMBULATORY_CARE_PROVIDER_SITE_OTHER): Payer: Medicare Other | Admitting: Vascular Surgery

## 2021-01-18 VITALS — BP 90/64 | HR 79 | Ht 70.0 in | Wt 224.0 lb

## 2021-01-18 DIAGNOSIS — I6523 Occlusion and stenosis of bilateral carotid arteries: Secondary | ICD-10-CM

## 2021-01-18 DIAGNOSIS — E782 Mixed hyperlipidemia: Secondary | ICD-10-CM

## 2021-01-18 DIAGNOSIS — K219 Gastro-esophageal reflux disease without esophagitis: Secondary | ICD-10-CM

## 2021-01-18 DIAGNOSIS — G458 Other transient cerebral ischemic attacks and related syndromes: Secondary | ICD-10-CM | POA: Diagnosis not present

## 2021-01-18 DIAGNOSIS — R42 Dizziness and giddiness: Secondary | ICD-10-CM

## 2021-01-18 DIAGNOSIS — R55 Syncope and collapse: Secondary | ICD-10-CM

## 2021-01-18 DIAGNOSIS — I10 Essential (primary) hypertension: Secondary | ICD-10-CM

## 2021-01-18 DIAGNOSIS — I70213 Atherosclerosis of native arteries of extremities with intermittent claudication, bilateral legs: Secondary | ICD-10-CM

## 2021-01-19 DIAGNOSIS — I771 Stricture of artery: Secondary | ICD-10-CM | POA: Diagnosis not present

## 2021-01-19 DIAGNOSIS — N410 Acute prostatitis: Secondary | ICD-10-CM | POA: Diagnosis not present

## 2021-01-21 ENCOUNTER — Encounter (INDEPENDENT_AMBULATORY_CARE_PROVIDER_SITE_OTHER): Payer: Self-pay | Admitting: Vascular Surgery

## 2021-01-21 DIAGNOSIS — G458 Other transient cerebral ischemic attacks and related syndromes: Secondary | ICD-10-CM | POA: Insufficient documentation

## 2021-01-21 DIAGNOSIS — R55 Syncope and collapse: Secondary | ICD-10-CM | POA: Insufficient documentation

## 2021-01-23 ENCOUNTER — Encounter (INDEPENDENT_AMBULATORY_CARE_PROVIDER_SITE_OTHER): Payer: Self-pay

## 2021-01-23 DIAGNOSIS — G4733 Obstructive sleep apnea (adult) (pediatric): Secondary | ICD-10-CM | POA: Diagnosis not present

## 2021-01-24 NOTE — Telephone Encounter (Signed)
This patient saw Dr. Gilda Crease.  Please bring this to his intention when he is in office tomorrow, he is only 1 that can edit his note.

## 2021-01-30 DIAGNOSIS — G4700 Insomnia, unspecified: Secondary | ICD-10-CM | POA: Diagnosis not present

## 2021-01-30 DIAGNOSIS — F411 Generalized anxiety disorder: Secondary | ICD-10-CM | POA: Diagnosis not present

## 2021-01-30 DIAGNOSIS — F331 Major depressive disorder, recurrent, moderate: Secondary | ICD-10-CM | POA: Diagnosis not present

## 2021-04-25 DIAGNOSIS — G4733 Obstructive sleep apnea (adult) (pediatric): Secondary | ICD-10-CM | POA: Diagnosis not present

## 2021-04-30 DIAGNOSIS — R739 Hyperglycemia, unspecified: Secondary | ICD-10-CM | POA: Diagnosis not present

## 2021-04-30 DIAGNOSIS — F331 Major depressive disorder, recurrent, moderate: Secondary | ICD-10-CM | POA: Diagnosis not present

## 2021-04-30 DIAGNOSIS — Z125 Encounter for screening for malignant neoplasm of prostate: Secondary | ICD-10-CM | POA: Diagnosis not present

## 2021-04-30 DIAGNOSIS — F411 Generalized anxiety disorder: Secondary | ICD-10-CM | POA: Diagnosis not present

## 2021-04-30 DIAGNOSIS — E782 Mixed hyperlipidemia: Secondary | ICD-10-CM | POA: Diagnosis not present

## 2021-04-30 DIAGNOSIS — E538 Deficiency of other specified B group vitamins: Secondary | ICD-10-CM | POA: Diagnosis not present

## 2021-05-07 DIAGNOSIS — E538 Deficiency of other specified B group vitamins: Secondary | ICD-10-CM | POA: Diagnosis not present

## 2021-05-07 DIAGNOSIS — Z0000 Encounter for general adult medical examination without abnormal findings: Secondary | ICD-10-CM | POA: Diagnosis not present

## 2021-05-25 DIAGNOSIS — G4733 Obstructive sleep apnea (adult) (pediatric): Secondary | ICD-10-CM | POA: Diagnosis not present

## 2021-06-20 DIAGNOSIS — F331 Major depressive disorder, recurrent, moderate: Secondary | ICD-10-CM | POA: Diagnosis not present

## 2021-06-20 DIAGNOSIS — F411 Generalized anxiety disorder: Secondary | ICD-10-CM | POA: Diagnosis not present

## 2021-06-25 DIAGNOSIS — G4733 Obstructive sleep apnea (adult) (pediatric): Secondary | ICD-10-CM | POA: Diagnosis not present

## 2021-06-28 DIAGNOSIS — Z981 Arthrodesis status: Secondary | ICD-10-CM | POA: Diagnosis not present

## 2021-06-28 DIAGNOSIS — M4322 Fusion of spine, cervical region: Secondary | ICD-10-CM | POA: Diagnosis not present

## 2021-07-02 DIAGNOSIS — R0609 Other forms of dyspnea: Secondary | ICD-10-CM | POA: Diagnosis not present

## 2021-07-02 DIAGNOSIS — R600 Localized edema: Secondary | ICD-10-CM | POA: Diagnosis not present

## 2021-07-23 ENCOUNTER — Ambulatory Visit (INDEPENDENT_AMBULATORY_CARE_PROVIDER_SITE_OTHER): Payer: Medicare Other | Admitting: Vascular Surgery

## 2021-07-23 ENCOUNTER — Ambulatory Visit (INDEPENDENT_AMBULATORY_CARE_PROVIDER_SITE_OTHER): Payer: Medicare Other

## 2021-07-23 ENCOUNTER — Other Ambulatory Visit: Payer: Self-pay

## 2021-07-23 ENCOUNTER — Encounter (INDEPENDENT_AMBULATORY_CARE_PROVIDER_SITE_OTHER): Payer: Self-pay | Admitting: Vascular Surgery

## 2021-07-23 VITALS — BP 118/80 | HR 80 | Resp 16 | Wt 240.0 lb

## 2021-07-23 DIAGNOSIS — I771 Stricture of artery: Secondary | ICD-10-CM | POA: Diagnosis not present

## 2021-07-23 DIAGNOSIS — I10 Essential (primary) hypertension: Secondary | ICD-10-CM

## 2021-07-23 DIAGNOSIS — G458 Other transient cerebral ischemic attacks and related syndromes: Secondary | ICD-10-CM

## 2021-07-23 DIAGNOSIS — I70213 Atherosclerosis of native arteries of extremities with intermittent claudication, bilateral legs: Secondary | ICD-10-CM

## 2021-07-23 DIAGNOSIS — R55 Syncope and collapse: Secondary | ICD-10-CM | POA: Diagnosis not present

## 2021-07-23 DIAGNOSIS — I6523 Occlusion and stenosis of bilateral carotid arteries: Secondary | ICD-10-CM

## 2021-07-23 NOTE — Progress Notes (Signed)
MRN : [id number]  Darrell Schmidt is a 61 y.o. (Dec 03, 1960) male who presents with chief complaint of check my neck.  History of Present Illness:  The patient is seen for follow up regarding left subclavian stenosis. The patient describes several syncopal episodes, calling it a light headedness and denies the "room spinning".  It lasted on the order of minutes and resolved completely.  There was no loss of consciousness.  There have several episodes but they are unpredictable and intermittent.  Patient denies any pattern or consistency.  He is now also having increased left arm pain with activity.   There is no recent history of TIA symptoms or focal motor deficits. There is no prior documented CVA.   He has documented discrepancy of his blood pressures with the left arm being lower.  He denies hand pain or claudication no ulcers or fissures   The patient is taking enteric-coated aspirin 81 mg daily at the time.   There is no history of migraine headaches or prior diagnosis of ocular migraine. There is no history of seizures.   The patient denies claudication symptoms.   The does not have a history of coronary artery disease, no recent episodes of angina or shortness of breath. There is a history of hyperlipidemia which is being treated with a statin.     Carotid duplex shows minimal disease in the carotid arteries.  The right vertebral artery is antegrade but the left vertebral artery is now retrograde. The left subclavian shows monophasic flow suggesting a hemodynamically significant stenosis.   Current Meds  Medication Sig   DULoxetine (CYMBALTA) 30 MG capsule Take 30 mg by mouth 2 (two) times daily.   furosemide (LASIX) 20 MG tablet Take 20 mg by mouth daily.   HYDROcodone-acetaminophen (NORCO/VICODIN) 5-325 MG tablet Take 1 tablet by mouth 3 (three) times daily as needed for pain.   methocarbamol (ROBAXIN) 500 MG tablet Take 1 tablet (500 mg total) by mouth every 6 (six) hours  as needed for muscle spasms.   NARCAN 4 MG/0.1ML LIQD nasal spray kit Place 1 spray into the nose daily as needed for opioid reversal.   pantoprazole (PROTONIX) 40 MG tablet Take 40 mg by mouth daily at 4 PM.   ramelteon (ROZEREM) 8 MG tablet SMARTSIG:1 Tablet(s) By Mouth Every Evening   traMADol (ULTRAM) 50 MG tablet Take 50 mg by mouth 3 (three) times daily as needed for pain.    Past Medical History:  Diagnosis Date   Depression    History of kidney stones    Hypertension    Sleep apnea    some apnea, but is waiting on Cpap machine     Past Surgical History:  Procedure Laterality Date   ANTERIOR CERVICAL DECOMP/DISCECTOMY FUSION N/A 09/20/2020   Procedure: ANTERIOR CERVICAL DECOMPRESSION/DISCECTOMY FUSION 1 LEVEL C6-7;  Surgeon: Meade Maw, MD;  Location: [facility] ORS;  Service: Neurosurgery;  Laterality: N/A;  1st case   BACK SURGERY  2015   lumbar Discectomy    BACK SURGERY  2016   Lumbar Discectomy    NOSE SURGERY  1980   nose surgery   1980   TOE SURGERY Right 2007    Social History Social History   Tobacco Use   Smoking status: Former    Types: Cigarettes    Quit date: 05/27/2009    Years since quitting: 12.1   Smokeless tobacco: Never  Vaping Use   Vaping Use: Every day   Devices: with some nicotine  Substance Use Topics   Alcohol use: Never   Drug use: Never    Family History No family history on file.  Allergies  Allergen Reactions   Aspirin Anaphylaxis   Ace Inhibitors Cough   Atorvastatin     Other reaction(s): Muscle Pain   Rosuvastatin Other (See Comments)    Skin dryness, memory issues.     REVIEW OF SYSTEMS (Negative unless checked)  Constitutional: [] Weight loss  [] Fever  [] Chills Cardiac: [] Chest pain   [] Chest pressure   [] Palpitations   [] Shortness of breath when laying flat   [] Shortness of breath with exertion. Vascular:  [] Pain in legs with walking   [] Pain in legs at rest  [] History of DVT   [] Phlebitis   [] Swelling in legs    [] Varicose veins   [] Non-healing ulcers Pulmonary:   [] Uses home oxygen   [] Productive cough   [] Hemoptysis   [] Wheeze  [] COPD   [] Asthma Neurologic:  [x] Dizziness   [] Seizures   [] History of stroke   [] History of TIA  [] Aphasia   [] Vissual changes   [] Weakness or numbness in arm   [] Weakness or numbness in leg Musculoskeletal:   [] Joint swelling   [] Joint pain   [] Low back pain Hematologic:  [] Easy bruising  [] Easy bleeding   [] Hypercoagulable state   [] Anemic Gastrointestinal:  [] Diarrhea   [] Vomiting  [x] Gastroesophageal reflux/heartburn   [] Difficulty swallowing. Genitourinary:  [] Chronic kidney disease   [] Difficult urination  [] Frequent urination   [] Blood in urine Skin:  [] Rashes   [] Ulcers  Psychological:  [] History of anxiety   []  History of major depression.  Physical Examination  Vitals:   07/23/21 0906 07/23/21 0907  BP: (!) 165/74 118/80  Pulse: 80   Resp: 16   Weight: 240 lb (108.9 kg)    Body mass index is 34.44 kg/m. Gen: WD/WN, NAD Head: [place]/AT, No temporalis wasting.  Ear/Nose/Throat: Hearing grossly intact, nares w/o erythema or drainage Eyes: PER, EOMI, sclera nonicteric.  Neck: Supple, no masses.  No bruit or JVD.  Pulmonary:  Good air movement, no audible wheezing, no use of accessory muscles.  Cardiac: RRR, normal S1, S2, no Murmurs. Vascular:   no carotid bruits Vessel Right Left  Radial Palpable Not Palpable  Carotid Palpable Palpable  Gastrointestinal: soft, non-distended. No guarding/no peritoneal signs.  Musculoskeletal: M/S 5/5 throughout.  No visible deformity.  Neurologic: CN 2-12 intact. Pain and light touch intact in extremities.  Symmetrical.  Speech is fluent. Motor exam as listed above. Psychiatric: Judgment intact, Mood & affect appropriate for pt's clinical situation. Dermatologic: No rashes or ulcers noted.  No changes consistent with cellulitis.   CBC Lab Results  Component Value Date   WBC 10.0 09/13/2020   HGB 13.5 09/13/2020   HCT  39.8 09/13/2020   MCV 90.5 09/13/2020   PLT 294 09/13/2020    BMET    Component Value Date/Time   NA 139 09/13/2020 1143   K 3.9 09/13/2020 1143   CL 103 09/13/2020 1143   CO2 27 09/13/2020 1143   GLUCOSE 127 (H) 09/13/2020 1143   BUN 16 09/13/2020 1143   CREATININE 0.80 09/13/2020 1143   CALCIUM 9.3 09/13/2020 1143   GFRNONAA >60 09/13/2020 1143   CrCl cannot be calculated (Patient's most recent lab result is older than the maximum 21 days allowed.).  COAG Lab Results  Component Value Date   INR 0.9 09/13/2020    Radiology No results found.   Assessment/Plan 1. Subclavian steal syndrome The patient has become increasingly symptomatic  with respect to his probable subclavian artery stenosis/occlusion.  He is now experiencing significant arm pain.  Furthermore, based on his duplex ultrasound today there is now reversal of his vertebral artery flow on the left consistent with subclavian steal syndrome.  This would also explain his ongoing syncopal episodes.  Patient should undergo CT angiography of the chest, neck and head to define the degree of stenosis of the subclavian and vertebral arteries bilaterally and the anatomic suitability for surgery vs. intervention.  If the patient does indeed need surgery cardiac clearance will be required, once cleared the patient will be scheduled for surgery.  The risks, benefits and alternative therapies were reviewed in detail with the patient.  All questions were answered.  The patient agrees to proceed with imaging.  Continue antiplatelet therapy as prescribed. Continue management of CAD, HTN and Hyperlipidemia. Healthy heart diet, encouraged exercise at least 4 times per week.    - CT ANGIO NECK W OR WO CONTRAST; Future - CT ANGIO HEAD W OR WO CONTRAST; Future - CT Angio Chest/Abd/Pel for Dissection W and/or W/WO; Future  2. Subclavian artery stenosis, left (HCC) The patient has become increasingly symptomatic with respect to his  probable subclavian artery stenosis/occlusion.  He is now experiencing significant arm pain.  Furthermore, based on his duplex ultrasound today there is now reversal of his vertebral artery flow on the left consistent with subclavian steal syndrome.  This would also explain his ongoing syncopal episodes.  Patient should undergo CT angiography of the chest, neck and head to define the degree of stenosis of the subclavian and vertebral arteries bilaterally and the anatomic suitability for surgery vs. intervention.  If the patient does indeed need surgery cardiac clearance will be required, once cleared the patient will be scheduled for surgery.  The risks, benefits and alternative therapies were reviewed in detail with the patient.  All questions were answered.  The patient agrees to proceed with imaging.  Continue antiplatelet therapy as prescribed. Continue management of CAD, HTN and Hyperlipidemia. Healthy heart diet, encouraged exercise at least 4 times per week. - CT ANGIO NECK W OR WO CONTRAST; Future - CT ANGIO HEAD W OR WO CONTRAST; Future - CT Angio Chest/Abd/Pel for Dissection W and/or W/WO; Future  3. Bilateral carotid artery stenosis The patient has become increasingly symptomatic with respect to his probable subclavian artery stenosis/occlusion.  He is now experiencing significant arm pain.  Furthermore, based on his duplex ultrasound today there is now reversal of his vertebral artery flow on the left consistent with subclavian steal syndrome.  This would also explain his ongoing syncopal episodes.  Patient should undergo CT angiography of the chest, neck and head to define the degree of stenosis of the subclavian and vertebral arteries bilaterally and the anatomic suitability for surgery vs. intervention.  If the patient does indeed need surgery cardiac clearance will be required, once cleared the patient will be scheduled for surgery.  The risks, benefits and alternative therapies  were reviewed in detail with the patient.  All questions were answered.  The patient agrees to proceed with imaging.  Continue antiplatelet therapy as prescribed. Continue management of CAD, HTN and Hyperlipidemia. Healthy heart diet, encouraged exercise at least 4 times per week.  4. Atherosclerosis of native artery of both lower extremities with intermittent claudication (HCC)  Recommend:  The patient has evidence of atherosclerosis of the lower extremities with claudication.  The patient does not voice lifestyle limiting changes at this point in time.  Noninvasive studies  do not suggest clinically significant change.  No invasive studies, angiography or surgery at this time The patient should continue walking and begin a more formal exercise program.  The patient should continue antiplatelet therapy and aggressive treatment of the lipid abnormalities  No changes in the patient's medications at this time  The patient should continue wearing graduated compression socks 10-15 mmHg strength to control the mild edema.    5. Essential hypertension Continue antihypertensive medications as already ordered, these medications have been reviewed and there are no changes at this time.     Hortencia Pilar, MD  07/23/2021 9:26 AM

## 2021-07-26 ENCOUNTER — Telehealth (INDEPENDENT_AMBULATORY_CARE_PROVIDER_SITE_OTHER): Payer: Self-pay | Admitting: *Deleted

## 2021-07-26 NOTE — Telephone Encounter (Signed)
Called patient to advise him of appointment made for his CTA-Head/Neck/Chest. The patient is set to have them done 08/10/21 at 10 arrive at 930 with liquids only for 4 hours prior.  ?

## 2021-07-30 DIAGNOSIS — R0602 Shortness of breath: Secondary | ICD-10-CM | POA: Diagnosis not present

## 2021-07-30 DIAGNOSIS — R600 Localized edema: Secondary | ICD-10-CM | POA: Diagnosis not present

## 2021-07-31 ENCOUNTER — Other Ambulatory Visit (INDEPENDENT_AMBULATORY_CARE_PROVIDER_SITE_OTHER): Payer: Self-pay | Admitting: Nurse Practitioner

## 2021-07-31 DIAGNOSIS — G458 Other transient cerebral ischemic attacks and related syndromes: Secondary | ICD-10-CM

## 2021-07-31 DIAGNOSIS — G4733 Obstructive sleep apnea (adult) (pediatric): Secondary | ICD-10-CM | POA: Diagnosis not present

## 2021-07-31 NOTE — Telephone Encounter (Signed)
Done

## 2021-08-10 ENCOUNTER — Ambulatory Visit
Admission: RE | Admit: 2021-08-10 | Discharge: 2021-08-10 | Disposition: A | Discharge status: Home / Self Care | Payer: Medicare Other | Source: Ambulatory Visit | Attending: Vascular Surgery | Admitting: Vascular Surgery

## 2021-08-10 ENCOUNTER — Ambulatory Visit: Payer: Medicare Other

## 2021-08-10 ENCOUNTER — Other Ambulatory Visit: Payer: Self-pay

## 2021-08-10 ENCOUNTER — Other Ambulatory Visit (INDEPENDENT_AMBULATORY_CARE_PROVIDER_SITE_OTHER): Payer: Self-pay | Admitting: Vascular Surgery

## 2021-08-10 DIAGNOSIS — I7774 Dissection of vertebral artery: Secondary | ICD-10-CM

## 2021-08-10 DIAGNOSIS — G458 Other transient cerebral ischemic attacks and related syndromes: Secondary | ICD-10-CM

## 2021-08-10 DIAGNOSIS — I771 Stricture of artery: Secondary | ICD-10-CM | POA: Diagnosis not present

## 2021-08-10 DIAGNOSIS — Z8673 Personal history of transient ischemic attack (TIA), and cerebral infarction without residual deficits: Secondary | ICD-10-CM | POA: Diagnosis not present

## 2021-08-10 DIAGNOSIS — J929 Pleural plaque without asbestos: Secondary | ICD-10-CM | POA: Diagnosis not present

## 2021-08-10 DIAGNOSIS — M79602 Pain in left arm: Secondary | ICD-10-CM | POA: Diagnosis not present

## 2021-08-10 DIAGNOSIS — H538 Other visual disturbances: Secondary | ICD-10-CM | POA: Diagnosis not present

## 2021-08-10 DIAGNOSIS — I82B12 Acute embolism and thrombosis of left subclavian vein: Secondary | ICD-10-CM | POA: Diagnosis not present

## 2021-08-10 DIAGNOSIS — I6503 Occlusion and stenosis of bilateral vertebral arteries: Secondary | ICD-10-CM | POA: Diagnosis not present

## 2021-08-10 DIAGNOSIS — R200 Anesthesia of skin: Secondary | ICD-10-CM | POA: Diagnosis not present

## 2021-08-10 DIAGNOSIS — R42 Dizziness and giddiness: Secondary | ICD-10-CM | POA: Diagnosis not present

## 2021-08-10 LAB — POCT I-STAT CREATININE: Creatinine, Ser: 0.9 mg/dL (ref 0.61–1.24)

## 2021-08-10 MED ORDER — IOHEXOL 350 MG/ML SOLN
100.0000 mL | Freq: Once | INTRAVENOUS | Status: AC | PRN
  Administered 2021-08-10: 150 mL via INTRAVENOUS

## 2021-08-13 ENCOUNTER — Encounter (INDEPENDENT_AMBULATORY_CARE_PROVIDER_SITE_OTHER): Payer: Self-pay | Admitting: Vascular Surgery

## 2021-08-13 ENCOUNTER — Ambulatory Visit (INDEPENDENT_AMBULATORY_CARE_PROVIDER_SITE_OTHER): Payer: Medicare Other | Admitting: Vascular Surgery

## 2021-08-13 ENCOUNTER — Other Ambulatory Visit: Payer: Self-pay

## 2021-08-13 VITALS — BP 116/75 | HR 78 | Resp 16 | Wt 237.8 lb

## 2021-08-13 DIAGNOSIS — I10 Essential (primary) hypertension: Secondary | ICD-10-CM | POA: Diagnosis not present

## 2021-08-13 DIAGNOSIS — I771 Stricture of artery: Secondary | ICD-10-CM | POA: Diagnosis not present

## 2021-08-13 DIAGNOSIS — I70213 Atherosclerosis of native arteries of extremities with intermittent claudication, bilateral legs: Secondary | ICD-10-CM | POA: Diagnosis not present

## 2021-08-13 DIAGNOSIS — G458 Other transient cerebral ischemic attacks and related syndromes: Secondary | ICD-10-CM

## 2021-08-13 DIAGNOSIS — I6523 Occlusion and stenosis of bilateral carotid arteries: Secondary | ICD-10-CM | POA: Diagnosis not present

## 2021-08-13 NOTE — Progress Notes (Signed)
? ? ?MRN : [id number] ? ?Darrell Schmidt is a 61 y.o. (1960-07-30) male who presents with chief complaint of follow up CT scan. ? ?History of Present Illness:  ? ?The patient is seen for follow up evaluation of carotid stenosis status post CT angiogram. CT scan was done 08/10/2021. Patient reports that the test went well with no problems or complications.  ? ?He continues to c/o syncope but no left hand pain at this time.  There is some left arm claudication. ? ?The patient denies interval amaurosis fugax. There is no recent or interval TIA symptoms or focal motor deficits. There is no prior documented CVA. ? ?The patient is taking enteric-coated aspirin 81 mg daily. ? ?There is no history of migraine headaches. There is no history of seizures. ? ?The patient has a history of coronary artery disease, no recent episodes of angina or shortness of breath. ?The patient denies PAD or claudication symptoms. ?There is a history of hyperlipidemia which is being treated with a statin.   ? ?CT angiogram is reviewed by me personally and shows mild carotid stenosis with proximal occlusion of the left subclavian. ? ? ?No outpatient medications have been marked as taking for the 08/13/21 encounter (Appointment) with Gilda Crease, Latina Craver, MD.  ? ? ?Past Medical History:  ?Diagnosis Date  ? Depression   ? History of kidney stones   ? Hypertension   ? Sleep apnea   ? some apnea, but is waiting on Cpap machine   ? ? ?Past Surgical History:  ?Procedure Laterality Date  ? ANTERIOR CERVICAL DECOMP/DISCECTOMY FUSION N/A 09/20/2020  ? Procedure: ANTERIOR CERVICAL DECOMPRESSION/DISCECTOMY FUSION 1 LEVEL C6-7;  Surgeon: Venetia Night, MD;  Location: [facility] ORS;  Service: Neurosurgery;  Laterality: N/A;  1st case  ? BACK SURGERY  2015  ? lumbar Discectomy   ? BACK SURGERY  2016  ? Lumbar Discectomy   ? NOSE SURGERY  1980  ? nose surgery   1980  ? TOE SURGERY Right 2007  ? ? ?Social History ?Social History  ? ?Tobacco Use  ? Smoking status:  Former  ?  Types: Cigarettes  ?  Quit date: 05/27/2009  ?  Years since quitting: 12.2  ? Smokeless tobacco: Never  ?Vaping Use  ? Vaping Use: Every day  ? Devices: with some nicotine   ?Substance Use Topics  ? Alcohol use: Never  ? Drug use: Never  ? ? ?Family History ?No family history on file. ? ?Allergies  ?Allergen Reactions  ? Aspirin Anaphylaxis  ? Ace Inhibitors Cough  ? Atorvastatin   ?  Other reaction(s): Muscle Pain  ? Rosuvastatin Other (See Comments)  ?  Skin dryness, memory issues.  ? ? ? ?REVIEW OF SYSTEMS (Negative unless checked) ? ?Constitutional: [] Weight loss  [] Fever  [] Chills ?Cardiac: [] Chest pain   [] Chest pressure   [] Palpitations   [] Shortness of breath when laying flat   [] Shortness of breath with exertion. ?Vascular:  [] Pain in legs with walking   [] Pain in legs at rest  [] History of DVT   [] Phlebitis   [] Swelling in legs   [] Varicose veins   [] Non-healing ulcers ?Pulmonary:   [] Uses home oxygen   [] Productive cough   [] Hemoptysis   [] Wheeze  [] COPD   [] Asthma ?Neurologic:  [] Dizziness   [] Seizures   [] History of stroke   [] History of TIA  [] Aphasia   [] Vissual changes   [] Weakness or numbness in arm   [] Weakness or numbness in leg ?Musculoskeletal:   [] Joint swelling   []   Joint pain   [] Low back pain ?Hematologic:  [] Easy bruising  [] Easy bleeding   [] Hypercoagulable state   [] Anemic ?Gastrointestinal:  [] Diarrhea   [] Vomiting  [] Gastroesophageal reflux/heartburn   [] Difficulty swallowing. ?Genitourinary:  [] Chronic kidney disease   [] Difficult urination  [] Frequent urination   [] Blood in urine ?Skin:  [] Rashes   [] Ulcers  ?Psychological:  [] History of anxiety   []  History of major depression. ? ?Physical Examination ? ?There were no vitals filed for this visit. ?There is no height or weight on file to calculate BMI. ?Gen: WD/WN, NAD ?Head: [place]/AT, No temporalis wasting.  ?Ear/Nose/Throat: Hearing grossly intact, nares w/o erythema or drainage ?Eyes: PER, EOMI, sclera nonicteric.  ?Neck:  Supple, no masses.  No bruit or JVD.  ?Pulmonary:  Good air movement, no audible wheezing, no use of accessory muscles.  ?Cardiac: RRR, normal S1, S2, no Murmurs. ?Vascular:   ?Vessel Right Left  ?Radial Palpable Not Palpable  ?Carotid Palpable Palpable  ?Gastrointestinal: soft, non-distended. No guarding/no peritoneal signs.  ?Musculoskeletal: M/S 5/5 throughout.  No visible deformity.  ?Neurologic: CN 2-12 intact. Pain and light touch intact in extremities.  Symmetrical.  Speech is fluent. Motor exam as listed above. ?Psychiatric: Judgment intact, Mood & affect appropriate for pt's clinical situation. ?Dermatologic: No rashes or ulcers noted.  No changes consistent with cellulitis. ? ? ?CBC ?Lab Results  ?Component Value Date  ? WBC 10.0 09/13/2020  ? HGB 13.5 09/13/2020  ? HCT 39.8 09/13/2020  ? MCV 90.5 09/13/2020  ? PLT 294 09/13/2020  ? ? ?BMET ?   ?Component Value Date/Time  ? NA 139 09/13/2020 1143  ? K 3.9 09/13/2020 1143  ? CL 103 09/13/2020 1143  ? CO2 27 09/13/2020 1143  ? GLUCOSE 127 (H) 09/13/2020 1143  ? BUN 16 09/13/2020 1143  ? CREATININE 0.90 08/10/2021 1004  ? CALCIUM 9.3 09/13/2020 1143  ? GFRNONAA >60 09/13/2020 1143  ? ?CrCl cannot be calculated (Unknown ideal weight.). ? ?COAG ?Lab Results  ?Component Value Date  ? INR 0.9 09/13/2020  ? ? ?Radiology ?CT ANGIO HEAD NECK W WO CM ? ?Result Date: 08/11/2021 ?CLINICAL DATA:  Syncope/presyncope, cerebrovascular cause suspected. Subclavian steal syndrome. Vertebral artery dissection. Pain and numbness in the left arm. Blurred vision and dizziness. EXAM: CT ANGIOGRAPHY HEAD AND NECK TECHNIQUE: Multidetector CT imaging of the head and neck was performed using the standard protocol during bolus administration of intravenous contrast. Multiplanar CT image reconstructions and MIPs were obtained to evaluate the vascular anatomy. Carotid stenosis measurements (when applicable) are obtained utilizing NASCET criteria, using the distal internal carotid diameter  as the denominator. RADIATION DOSE REDUCTION: This exam was performed according to the departmental dose-optimization program which includes automated exposure control, adjustment of the mA and/or kV according to patient size and/or use of iterative reconstruction technique. CONTRAST:  09/15/2020 OMNIPAQUE IOHEXOL 350 MG/ML SOLN COMPARISON:  Carotid Doppler ultrasound report from 07/23/2021. FINDINGS: CT HEAD FINDINGS Brain: There is no evidence of an acute infarct, intracranial hemorrhage, mass, midline shift, or extra-axial fluid collection. The ventricles and sulci are normal. Vascular: Reported below. Skull: No fracture or suspicious osseous lesion. Sinuses: Visualized paranasal sinuses and mastoid air cells are clear. Orbits: Unremarkable. Review of the MIP images confirms the above findings CTA NECK FINDINGS Aortic arch: Standard 3 vessel aortic arch with mild atherosclerotic plaque. Wide patency of the brachiocephalic and right subclavian arteries. Occlusion of the left subclavian artery proximally 2 cm distal to its origin with distal reconstitution likely by retrograde left vertebral  artery flow given prior ultrasound findings. The occluded segment of the left subclavian artery measures 1.5 cm in length. Right carotid system: Patent with mild atheromatous wall thickening. No evidence of dissection or stenosis. Left carotid system: Patent with mild atheromatous wall thickening and minimal calcified plaque in the proximal ICA. No evidence of dissection or stenosis. Vertebral arteries: The vertebral arteries are patent and codominant without evidence a dissection or a significant stenosis on the left. There is an approximately 50% stenosis of the right vertebral origin. Skeleton: C6-7 ACDF. Other neck: No evidence of cervical lymphadenopathy or mass. Upper chest: Reported on today's separate chest CTA. Review of the MIP images confirms the above findings CTA HEAD FINDINGS Anterior circulation: The internal carotid  arteries are patent from skull base to carotid termini with mild atherosclerotic irregularity but no significant stenosis. ACAs and MCAs are patent without evidence of a proximal branch occlusion or signif

## 2021-08-28 ENCOUNTER — Encounter (INDEPENDENT_AMBULATORY_CARE_PROVIDER_SITE_OTHER): Payer: Self-pay | Admitting: Vascular Surgery

## 2021-09-30 DIAGNOSIS — G4733 Obstructive sleep apnea (adult) (pediatric): Secondary | ICD-10-CM | POA: Diagnosis not present

## 2021-10-29 DIAGNOSIS — E538 Deficiency of other specified B group vitamins: Secondary | ICD-10-CM | POA: Diagnosis not present

## 2021-10-29 DIAGNOSIS — E782 Mixed hyperlipidemia: Secondary | ICD-10-CM | POA: Diagnosis not present

## 2021-10-31 DIAGNOSIS — G4733 Obstructive sleep apnea (adult) (pediatric): Secondary | ICD-10-CM | POA: Diagnosis not present

## 2021-11-05 DIAGNOSIS — E538 Deficiency of other specified B group vitamins: Secondary | ICD-10-CM | POA: Diagnosis not present

## 2021-11-05 DIAGNOSIS — F1190 Opioid use, unspecified, uncomplicated: Secondary | ICD-10-CM | POA: Diagnosis not present

## 2021-11-05 DIAGNOSIS — Z0000 Encounter for general adult medical examination without abnormal findings: Secondary | ICD-10-CM | POA: Diagnosis not present

## 2021-11-05 DIAGNOSIS — I771 Stricture of artery: Secondary | ICD-10-CM | POA: Diagnosis not present

## 2021-11-08 ENCOUNTER — Other Ambulatory Visit: Payer: Self-pay | Admitting: Internal Medicine

## 2021-11-08 DIAGNOSIS — G4733 Obstructive sleep apnea (adult) (pediatric): Secondary | ICD-10-CM | POA: Diagnosis not present

## 2021-11-08 DIAGNOSIS — I771 Stricture of artery: Secondary | ICD-10-CM | POA: Diagnosis not present

## 2021-11-08 DIAGNOSIS — E782 Mixed hyperlipidemia: Secondary | ICD-10-CM | POA: Diagnosis not present

## 2021-11-08 DIAGNOSIS — N6311 Unspecified lump in the right breast, upper outer quadrant: Secondary | ICD-10-CM

## 2021-11-08 DIAGNOSIS — I10 Essential (primary) hypertension: Secondary | ICD-10-CM | POA: Diagnosis not present

## 2021-11-09 ENCOUNTER — Ambulatory Visit
Admission: RE | Admit: 2021-11-09 | Discharge: 2021-11-09 | Disposition: A | Discharge status: Home / Self Care | Payer: Medicare Other | Source: Ambulatory Visit | Attending: Internal Medicine | Admitting: Internal Medicine

## 2021-11-09 DIAGNOSIS — N6311 Unspecified lump in the right breast, upper outer quadrant: Secondary | ICD-10-CM | POA: Insufficient documentation

## 2021-11-09 DIAGNOSIS — N62 Hypertrophy of breast: Secondary | ICD-10-CM | POA: Diagnosis not present

## 2021-11-16 DIAGNOSIS — N62 Hypertrophy of breast: Secondary | ICD-10-CM | POA: Diagnosis not present

## 2021-11-16 DIAGNOSIS — E785 Hyperlipidemia, unspecified: Secondary | ICD-10-CM | POA: Diagnosis not present

## 2021-11-28 DIAGNOSIS — R002 Palpitations: Secondary | ICD-10-CM | POA: Diagnosis not present

## 2021-11-28 DIAGNOSIS — G4733 Obstructive sleep apnea (adult) (pediatric): Secondary | ICD-10-CM | POA: Diagnosis not present

## 2021-11-28 DIAGNOSIS — I10 Essential (primary) hypertension: Secondary | ICD-10-CM | POA: Diagnosis not present

## 2021-11-28 DIAGNOSIS — I771 Stricture of artery: Secondary | ICD-10-CM | POA: Diagnosis not present

## 2021-12-10 DIAGNOSIS — G4733 Obstructive sleep apnea (adult) (pediatric): Secondary | ICD-10-CM | POA: Diagnosis not present

## 2022-01-10 DIAGNOSIS — G4733 Obstructive sleep apnea (adult) (pediatric): Secondary | ICD-10-CM | POA: Diagnosis not present

## 2022-02-08 DIAGNOSIS — N6311 Unspecified lump in the right breast, upper outer quadrant: Secondary | ICD-10-CM | POA: Diagnosis not present

## 2022-02-08 DIAGNOSIS — E782 Mixed hyperlipidemia: Secondary | ICD-10-CM | POA: Diagnosis not present

## 2022-02-20 DIAGNOSIS — Z23 Encounter for immunization: Secondary | ICD-10-CM | POA: Diagnosis not present

## 2022-02-20 DIAGNOSIS — H5340 Unspecified visual field defects: Secondary | ICD-10-CM | POA: Diagnosis not present

## 2022-02-20 DIAGNOSIS — I771 Stricture of artery: Secondary | ICD-10-CM | POA: Diagnosis not present

## 2022-02-21 ENCOUNTER — Telehealth (INDEPENDENT_AMBULATORY_CARE_PROVIDER_SITE_OTHER): Payer: Self-pay | Admitting: Vascular Surgery

## 2022-02-21 DIAGNOSIS — H47011 Ischemic optic neuropathy, right eye: Secondary | ICD-10-CM | POA: Diagnosis not present

## 2022-02-21 NOTE — Telephone Encounter (Signed)
Patient came into office wanting to let us know he now ready to get his angio scheduled. I let patient know I would let surgery scheduler know and she would get PA and everything started and reach out to patient    Please call and advise

## 2022-02-27 ENCOUNTER — Telehealth (INDEPENDENT_AMBULATORY_CARE_PROVIDER_SITE_OTHER): Payer: Self-pay

## 2022-02-27 NOTE — Telephone Encounter (Signed)
Patient called and stated he wanted to go ahead to schedule the stint to by put.  Please advise.

## 2022-03-20 NOTE — Progress Notes (Signed)
               MRN : [id number]  Darrell Schmidt is a 61 y.o. (09/28/1960) male who presents with chief complaint of check circulation.  History of Present Illness:   The patient is seen for follow up evaluation of carotid stenosis status post CT angiogram. CT scan was done 08/10/2021.    He continues to c/o syncope but no left hand pain at rest at this time.  There is some left arm claudication.  For example he had significant pain while trying to hang a shower curtain.   The patient denies interval amaurosis fugax. There is no recent or interval TIA symptoms or focal motor deficits. There is no prior documented CVA.   The patient is taking enteric-coated aspirin 81 mg daily.   There is no history of migraine headaches. There is no history of seizures.   The patient has a history of coronary artery disease, no recent episodes of angina or shortness of breath. The patient denies PAD or claudication symptoms. There is a history of hyperlipidemia which is being treated with a statin.     CT angiogram is reviewed by me personally and shows mild carotid stenosis with proximal occlusion of the left subclavian.  No outpatient medications have been marked as taking for the 03/25/22 encounter (Appointment) with Xitlalic Maslin G, MD.    Past Medical History:  Diagnosis Date   Depression    History of kidney stones    Hypertension    Sleep apnea    some apnea, but is waiting on Cpap machine     Past Surgical History:  Procedure Laterality Date   ANTERIOR CERVICAL DECOMP/DISCECTOMY FUSION N/A 09/20/2020   Procedure: ANTERIOR CERVICAL DECOMPRESSION/DISCECTOMY FUSION 1 LEVEL C6-7;  Surgeon: Yarbrough, Chester, MD;  Location: [facility] ORS;  Service: Neurosurgery;  Laterality: N/A;  1st case   BACK SURGERY  2015   lumbar Discectomy    BACK SURGERY  2016   Lumbar Discectomy    NOSE SURGERY  1980   nose surgery   1980   TOE SURGERY Right 2007    Social History Social History    Tobacco Use   Smoking status: Former    Types: Cigarettes    Quit date: 05/27/2009    Years since quitting: 12.8   Smokeless tobacco: Never  Vaping Use   Vaping Use: Every day   Devices: with some nicotine   Substance Use Topics   Alcohol use: Never   Drug use: Never    Family History No family history on file.  Allergies  Allergen Reactions   Aspirin Anaphylaxis   Ace Inhibitors Cough   Atorvastatin     Other reaction(s): Muscle Pain   Rosuvastatin Other (See Comments)    Skin dryness, memory issues.     REVIEW OF SYSTEMS (Negative unless checked)  Constitutional: []Weight loss  []Fever  []Chills Cardiac: []Chest pain   []Chest pressure   []Palpitations   []Shortness of breath when laying flat   []Shortness of breath with exertion. Vascular:  [x]Pain in legs with walking   []Pain in legs at rest  []History of DVT   []Phlebitis   []Swelling in legs   []Varicose veins   []Non-healing ulcers Pulmonary:   []Uses home oxygen   []Productive cough   []Hemoptysis   []Wheeze  []COPD   []Asthma Neurologic:  []Dizziness   []Seizures   []History of stroke   []History of TIA  []Aphasia   []Vissual   changes   []Weakness or numbness in arm   []Weakness or numbness in leg Musculoskeletal:   []Joint swelling   []Joint pain   []Low back pain Hematologic:  []Easy bruising  []Easy bleeding   []Hypercoagulable state   []Anemic Gastrointestinal:  []Diarrhea   []Vomiting  [x]Gastroesophageal reflux/heartburn   []Difficulty swallowing. Genitourinary:  []Chronic kidney disease   []Difficult urination  []Frequent urination   []Blood in urine Skin:  []Rashes   []Ulcers  Psychological:  []History of anxiety   [] History of major depression.  Physical Examination  There were no vitals filed for this visit. There is no height or weight on file to calculate BMI. Gen: WD/WN, NAD Head: [place]/AT, No temporalis wasting.  Ear/Nose/Throat: Hearing grossly intact, nares w/o erythema or drainage Eyes: PER,  EOMI, sclera nonicteric.  Neck: Supple, no masses.  No bruit or JVD.  Pulmonary:  Good air movement, no audible wheezing, no use of accessory muscles.  Cardiac: RRR, normal S1, S2, no Murmurs. Vascular:  mild trophic changes, no open wounds Vessel Right Left  Radial Palpable Not Palpable  Gastrointestinal: soft, non-distended. No guarding/no peritoneal signs.  Musculoskeletal: M/S 5/5 throughout.  No visible deformity.  Neurologic: CN 2-12 intact. Pain and light touch intact in extremities.  Symmetrical.  Speech is fluent. Motor exam as listed above. Psychiatric: Judgment intact, Mood & affect appropriate for pt's clinical situation. Dermatologic: No rashes or ulcers noted.  No changes consistent with cellulitis.   CBC Lab Results  Component Value Date   WBC 10.0 09/13/2020   HGB 13.5 09/13/2020   HCT 39.8 09/13/2020   MCV 90.5 09/13/2020   PLT 294 09/13/2020    BMET    Component Value Date/Time   NA 139 09/13/2020 1143   K 3.9 09/13/2020 1143   CL 103 09/13/2020 1143   CO2 27 09/13/2020 1143   GLUCOSE 127 (H) 09/13/2020 1143   BUN 16 09/13/2020 1143   CREATININE 0.90 08/10/2021 1004   CALCIUM 9.3 09/13/2020 1143   GFRNONAA >60 09/13/2020 1143   CrCl cannot be calculated (Patient's most recent lab result is older than the maximum 21 days allowed.).  COAG Lab Results  Component Value Date   INR 0.9 09/13/2020    Radiology No results found.   Assessment/Plan 1. Subclavian steal syndrome Recommend:  The patient has experienced increased claudication symptoms and is now describing lifestyle limiting claudication and appears to be having syncope secondary to subclavian steal syndrome.  Given the severity of the patient's occlusion of the left subclavian of the the left upper extremity symptoms the patient should undergo angiography with the hope for intervention.  Risk and benefits were reviewed the patient.  Indications for the procedure were reviewed.  All  questions were answered, the patient agrees to proceed with angiography and hopeful intervention of the left upper extremity.   The patient should continue walking and begin a more formal exercise program.  The patient should continue antiplatelet therapy and aggressive treatment of the lipid abnormalities  The patient will follow up with me after the angiogram.    2. Subclavian artery stenosis, left (HCC) See #1  3. Bilateral carotid artery stenosis Recommend:  Given the patient's asymptomatic subcritical stenosis no further invasive testing or surgery at this time.  Duplex ultrasound shows <30% stenosis bilaterally.  Continue antiplatelet therapy as prescribed Continue management of CAD, HTN and Hyperlipidemia Healthy heart diet,  encouraged exercise at least 4 times per week Follow up in 12 months with duplex ultrasound   and physical exam    4. Atherosclerosis of native artery of both lower extremities with intermittent claudication (HCC) Recommend:  I do not find evidence of life style limiting vascular disease. The patient specifically denies life style limitation.  Previous noninvasive studies including ABI's of the legs do not identify critical vascular problems.  The patient should continue walking and begin a more formal exercise program. The patient should continue his antiplatelet therapy and aggressive treatment of the lipid abnormalities.  The patient is instructed to call the office if there is a significant change in the lower extremity symptoms, particularly if a wound develops or there is an abrupt increase in leg pain.  5. Essential hypertension Continue antihypertensive medications as already ordered, these medications have been reviewed and there are no changes at this time.   6. Hyperlipidemia, mixed Continue statin as ordered and reviewed, no changes at this time     Hortencia Pilar, MD  03/20/2022 2:48 PM

## 2022-03-20 NOTE — H&P (View-Only) (Signed)
MRN : [id number]  Darrell Schmidt is a 61 y.o. (1960/08/31) male who presents with chief complaint of check circulation.  History of Present Illness:   The patient is seen for follow up evaluation of carotid stenosis status post CT angiogram. CT scan was done 08/10/2021.    He continues to c/o syncope but no left hand pain at rest at this time.  There is some left arm claudication.  For example he had significant pain while trying to hang a shower curtain.   The patient denies interval amaurosis fugax. There is no recent or interval TIA symptoms or focal motor deficits. There is no prior documented CVA.   The patient is taking enteric-coated aspirin 81 mg daily.   There is no history of migraine headaches. There is no history of seizures.   The patient has a history of coronary artery disease, no recent episodes of angina or shortness of breath. The patient denies PAD or claudication symptoms. There is a history of hyperlipidemia which is being treated with a statin.     CT angiogram is reviewed by me personally and shows mild carotid stenosis with proximal occlusion of the left subclavian.  No outpatient medications have been marked as taking for the 03/25/22 encounter (Appointment) with [name], [name], MD.    Past Medical History:  Diagnosis Date   Depression    History of kidney stones    Hypertension    Sleep apnea    some apnea, but is waiting on Cpap machine     Past Surgical History:  Procedure Laterality Date   ANTERIOR CERVICAL DECOMP/DISCECTOMY FUSION N/A 09/20/2020   Procedure: ANTERIOR CERVICAL DECOMPRESSION/DISCECTOMY FUSION 1 LEVEL C6-7;  Surgeon: Meade Maw, MD;  Location: [facility] ORS;  Service: Neurosurgery;  Laterality: N/A;  1st case   BACK SURGERY  2015   lumbar Discectomy    BACK SURGERY  2016   Lumbar Discectomy    NOSE SURGERY  1980   nose surgery   1980   TOE SURGERY Right 2007    Social History Social History    Tobacco Use   Smoking status: Former    Types: Cigarettes    Quit date: 05/27/2009    Years since quitting: 12.8   Smokeless tobacco: Never  Vaping Use   Vaping Use: Every day   Devices: with some nicotine   Substance Use Topics   Alcohol use: Never   Drug use: Never    Family History No family history on file.  Allergies  Allergen Reactions   Aspirin Anaphylaxis   Ace Inhibitors Cough   Atorvastatin     Other reaction(s): Muscle Pain   Rosuvastatin Other (See Comments)    Skin dryness, memory issues.     REVIEW OF SYSTEMS (Negative unless checked)  Constitutional: [] Weight loss  [] Fever  [] Chills Cardiac: [] Chest pain   [] Chest pressure   [] Palpitations   [] Shortness of breath when laying flat   [] Shortness of breath with exertion. Vascular:  [x] Pain in legs with walking   [] Pain in legs at rest  [] History of DVT   [] Phlebitis   [] Swelling in legs   [] Varicose veins   [] Non-healing ulcers Pulmonary:   [] Uses home oxygen   [] Productive cough   [] Hemoptysis   [] Wheeze  [] COPD   [] Asthma Neurologic:  [] Dizziness   [] Seizures   [] History of stroke   [] History of TIA  [] Aphasia   [] Vissual  changes   [] Weakness or numbness in arm   [] Weakness or numbness in leg Musculoskeletal:   [] Joint swelling   [] Joint pain   [] Low back pain Hematologic:  [] Easy bruising  [] Easy bleeding   [] Hypercoagulable state   [] Anemic Gastrointestinal:  [] Diarrhea   [] Vomiting  [x] Gastroesophageal reflux/heartburn   [] Difficulty swallowing. Genitourinary:  [] Chronic kidney disease   [] Difficult urination  [] Frequent urination   [] Blood in urine Skin:  [] Rashes   [] Ulcers  Psychological:  [] History of anxiety   []  History of major depression.  Physical Examination  There were no vitals filed for this visit. There is no height or weight on file to calculate BMI. Gen: WD/WN, NAD Head: [place]/AT, No temporalis wasting.  Ear/Nose/Throat: Hearing grossly intact, nares w/o erythema or drainage Eyes: PER,  EOMI, sclera nonicteric.  Neck: Supple, no masses.  No bruit or JVD.  Pulmonary:  Good air movement, no audible wheezing, no use of accessory muscles.  Cardiac: RRR, normal S1, S2, no Murmurs. Vascular:  mild trophic changes, no open wounds Vessel Right Left  Radial Palpable Not Palpable  Gastrointestinal: soft, non-distended. No guarding/no peritoneal signs.  Musculoskeletal: M/S 5/5 throughout.  No visible deformity.  Neurologic: CN 2-12 intact. Pain and light touch intact in extremities.  Symmetrical.  Speech is fluent. Motor exam as listed above. Psychiatric: Judgment intact, Mood & affect appropriate for pt's clinical situation. Dermatologic: No rashes or ulcers noted.  No changes consistent with cellulitis.   CBC Lab Results  Component Value Date   WBC 10.0 09/13/2020   HGB 13.5 09/13/2020   HCT 39.8 09/13/2020   MCV 90.5 09/13/2020   PLT 294 09/13/2020    BMET    Component Value Date/Time   NA 139 09/13/2020 1143   K 3.9 09/13/2020 1143   CL 103 09/13/2020 1143   CO2 27 09/13/2020 1143   GLUCOSE 127 (H) 09/13/2020 1143   BUN 16 09/13/2020 1143   CREATININE 0.90 08/10/2021 1004   CALCIUM 9.3 09/13/2020 1143   GFRNONAA >60 09/13/2020 1143   CrCl cannot be calculated (Patient's most recent lab result is older than the maximum 21 days allowed.).  COAG Lab Results  Component Value Date   INR 0.9 09/13/2020    Radiology No results found.   Assessment/Plan 1. Subclavian steal syndrome Recommend:  The patient has experienced increased claudication symptoms and is now describing lifestyle limiting claudication and appears to be having syncope secondary to subclavian steal syndrome.  Given the severity of the patient's occlusion of the left subclavian of the the left upper extremity symptoms the patient should undergo angiography with the hope for intervention.  Risk and benefits were reviewed the patient.  Indications for the procedure were reviewed.  All  questions were answered, the patient agrees to proceed with angiography and hopeful intervention of the left upper extremity.   The patient should continue walking and begin a more formal exercise program.  The patient should continue antiplatelet therapy and aggressive treatment of the lipid abnormalities  The patient will follow up with me after the angiogram.    2. Subclavian artery stenosis, left ([place]) See #1  3. Bilateral carotid artery stenosis Recommend:  Given the patient's asymptomatic subcritical stenosis no further invasive testing or surgery at this time.  Duplex ultrasound shows <30% stenosis bilaterally.  Continue antiplatelet therapy as prescribed Continue management of CAD, HTN and Hyperlipidemia Healthy heart diet,  encouraged exercise at least 4 times per week Follow up in 12 months with duplex ultrasound  and physical exam    4. Atherosclerosis of native artery of both lower extremities with intermittent claudication (HCC) Recommend:  I do not find evidence of life style limiting vascular disease. The patient specifically denies life style limitation.  Previous noninvasive studies including ABI's of the legs do not identify critical vascular problems.  The patient should continue walking and begin a more formal exercise program. The patient should continue his antiplatelet therapy and aggressive treatment of the lipid abnormalities.  The patient is instructed to call the office if there is a significant change in the lower extremity symptoms, particularly if a wound develops or there is an abrupt increase in leg pain.  5. Essential hypertension Continue antihypertensive medications as already ordered, these medications have been reviewed and there are no changes at this time.   6. Hyperlipidemia, mixed Continue statin as ordered and reviewed, no changes at this time     Hortencia Pilar, MD  03/20/2022 2:48 PM

## 2022-03-22 DIAGNOSIS — J40 Bronchitis, not specified as acute or chronic: Secondary | ICD-10-CM | POA: Diagnosis not present

## 2022-03-24 DIAGNOSIS — G4733 Obstructive sleep apnea (adult) (pediatric): Secondary | ICD-10-CM | POA: Diagnosis not present

## 2022-03-25 ENCOUNTER — Encounter (INDEPENDENT_AMBULATORY_CARE_PROVIDER_SITE_OTHER): Payer: Self-pay

## 2022-03-25 ENCOUNTER — Encounter (INDEPENDENT_AMBULATORY_CARE_PROVIDER_SITE_OTHER): Payer: Self-pay | Admitting: Vascular Surgery

## 2022-03-25 ENCOUNTER — Ambulatory Visit (INDEPENDENT_AMBULATORY_CARE_PROVIDER_SITE_OTHER): Payer: Medicare Other | Admitting: Vascular Surgery

## 2022-03-25 VITALS — BP 162/73 | HR 76 | Resp 18 | Ht 70.5 in | Wt 245.6 lb

## 2022-03-25 DIAGNOSIS — I10 Essential (primary) hypertension: Secondary | ICD-10-CM

## 2022-03-25 DIAGNOSIS — I70213 Atherosclerosis of native arteries of extremities with intermittent claudication, bilateral legs: Secondary | ICD-10-CM

## 2022-03-25 DIAGNOSIS — I6523 Occlusion and stenosis of bilateral carotid arteries: Secondary | ICD-10-CM

## 2022-03-25 DIAGNOSIS — I771 Stricture of artery: Secondary | ICD-10-CM | POA: Diagnosis not present

## 2022-03-25 DIAGNOSIS — E782 Mixed hyperlipidemia: Secondary | ICD-10-CM

## 2022-03-25 DIAGNOSIS — G458 Other transient cerebral ischemic attacks and related syndromes: Secondary | ICD-10-CM

## 2022-03-26 ENCOUNTER — Telehealth (INDEPENDENT_AMBULATORY_CARE_PROVIDER_SITE_OTHER): Payer: Self-pay

## 2022-03-26 NOTE — Telephone Encounter (Unsigned)
Spoke with the patient and he is scheduled with Dr. [name] for a left arm angio with subclavian stent with a 12:00 pm arrival time to the MM. Pre-procedure instructions were discussed and will be mailed.

## 2022-04-02 DIAGNOSIS — H47011 Ischemic optic neuropathy, right eye: Secondary | ICD-10-CM | POA: Diagnosis not present

## 2022-04-03 DIAGNOSIS — Z789 Other specified health status: Secondary | ICD-10-CM | POA: Insufficient documentation

## 2022-04-03 DIAGNOSIS — R002 Palpitations: Secondary | ICD-10-CM | POA: Diagnosis not present

## 2022-04-03 DIAGNOSIS — G4733 Obstructive sleep apnea (adult) (pediatric): Secondary | ICD-10-CM | POA: Diagnosis not present

## 2022-04-03 DIAGNOSIS — I771 Stricture of artery: Secondary | ICD-10-CM | POA: Diagnosis not present

## 2022-04-03 DIAGNOSIS — I10 Essential (primary) hypertension: Secondary | ICD-10-CM | POA: Diagnosis not present

## 2022-04-09 ENCOUNTER — Other Ambulatory Visit: Payer: Self-pay

## 2022-04-09 ENCOUNTER — Encounter
Admission: RE | Disposition: A | Discharge status: Home / Self Care | Payer: Self-pay | Source: Home / Self Care | Attending: Vascular Surgery

## 2022-04-09 ENCOUNTER — Encounter: Payer: Self-pay | Admitting: Vascular Surgery

## 2022-04-09 ENCOUNTER — Ambulatory Visit
Admission: RE | Admit: 2022-04-09 | Discharge: 2022-04-09 | Disposition: A | Discharge status: Home / Self Care | Payer: Medicare Other | Attending: Vascular Surgery | Admitting: Vascular Surgery

## 2022-04-09 DIAGNOSIS — I6523 Occlusion and stenosis of bilateral carotid arteries: Secondary | ICD-10-CM | POA: Insufficient documentation

## 2022-04-09 DIAGNOSIS — Z87891 Personal history of nicotine dependence: Secondary | ICD-10-CM | POA: Insufficient documentation

## 2022-04-09 DIAGNOSIS — I771 Stricture of artery: Secondary | ICD-10-CM | POA: Insufficient documentation

## 2022-04-09 DIAGNOSIS — I708 Atherosclerosis of other arteries: Secondary | ICD-10-CM

## 2022-04-09 DIAGNOSIS — G458 Other transient cerebral ischemic attacks and related syndromes: Secondary | ICD-10-CM | POA: Insufficient documentation

## 2022-04-09 DIAGNOSIS — I70213 Atherosclerosis of native arteries of extremities with intermittent claudication, bilateral legs: Secondary | ICD-10-CM | POA: Insufficient documentation

## 2022-04-09 DIAGNOSIS — I2510 Atherosclerotic heart disease of native coronary artery without angina pectoris: Secondary | ICD-10-CM | POA: Diagnosis not present

## 2022-04-09 DIAGNOSIS — Z7982 Long term (current) use of aspirin: Secondary | ICD-10-CM | POA: Insufficient documentation

## 2022-04-09 DIAGNOSIS — E782 Mixed hyperlipidemia: Secondary | ICD-10-CM | POA: Insufficient documentation

## 2022-04-09 DIAGNOSIS — I10 Essential (primary) hypertension: Secondary | ICD-10-CM | POA: Diagnosis not present

## 2022-04-09 HISTORY — PX: UPPER EXTREMITY ANGIOGRAPHY: CATH118270

## 2022-04-09 LAB — BUN: BUN: 18 mg/dL (ref 8–23)

## 2022-04-09 LAB — CREATININE, SERUM
Creatinine, Ser: 0.81 mg/dL (ref 0.61–1.24)
GFR, Estimated: >60 mL/min (ref >60)

## 2022-04-09 SURGERY — UPPER EXTREMITY ANGIOGRAPHY
Anesthesia: Moderate Sedation | Laterality: Left

## 2022-04-09 MED ORDER — CEFAZOLIN SODIUM-DEXTROSE 2-4 GM/100ML-% IV SOLN: 2.0000 g | INTRAVENOUS | Status: AC

## 2022-04-09 MED ORDER — SODIUM CHLORIDE 0.9% FLUSH: 3.0000 mL | Freq: Two times a day (BID) | INTRAVENOUS | Status: DC

## 2022-04-09 MED ORDER — ACETAMINOPHEN 325 MG PO TABS: 650.0000 mg | ORAL_TABLET | ORAL | Status: DC | PRN

## 2022-04-09 MED ORDER — CLOPIDOGREL BISULFATE 75 MG PO TABS: 75.0000 mg | ORAL_TABLET | Freq: Every day | ORAL | 5 refills | Status: DC

## 2022-04-09 MED ORDER — CEFAZOLIN SODIUM-DEXTROSE 2-4 GM/100ML-% IV SOLN
INTRAVENOUS | Status: AC
  Administered 2022-04-09: 2 g via INTRAVENOUS
  Filled 2022-04-09: qty 100

## 2022-04-09 MED ORDER — DIPHENHYDRAMINE HCL 50 MG/ML IJ SOLN: 50.0000 mg | Freq: Once | INTRAMUSCULAR | Status: DC | PRN

## 2022-04-09 MED ORDER — HEPARIN SODIUM (PORCINE) 1000 UNIT/ML IJ SOLN
INTRAMUSCULAR | Status: DC | PRN
  Administered 2022-04-09: 6000 [IU] via INTRAVENOUS

## 2022-04-09 MED ORDER — CLOPIDOGREL BISULFATE 300 MG PO TABS: 300.0000 mg | ORAL_TABLET | ORAL | Status: AC

## 2022-04-09 MED ORDER — SODIUM CHLORIDE 0.9 % IV SOLN: 250.0000 mL | INTRAVENOUS | Status: DC | PRN

## 2022-04-09 MED ORDER — SODIUM CHLORIDE 0.9% FLUSH: 3.0000 mL | INTRAVENOUS | Status: DC | PRN

## 2022-04-09 MED ORDER — SODIUM CHLORIDE 0.9 % IV SOLN: INTRAVENOUS | Status: DC

## 2022-04-09 MED ORDER — OXYCODONE HCL 5 MG PO TABS: 5.0000 mg | ORAL_TABLET | ORAL | Status: DC | PRN

## 2022-04-09 MED ORDER — METHYLPREDNISOLONE SODIUM SUCC 125 MG IJ SOLR
125.0000 mg | Freq: Once | INTRAMUSCULAR | Status: DC | PRN
Start: 2022-04-09 — End: 2022-04-09

## 2022-04-09 MED ORDER — MIDAZOLAM HCL 5 MG/5ML IJ SOLN
INTRAMUSCULAR | Status: AC
  Filled 2022-04-09: qty 5

## 2022-04-09 MED ORDER — LABETALOL HCL 5 MG/ML IV SOLN: 10.0000 mg | INTRAVENOUS | Status: DC | PRN

## 2022-04-09 MED ORDER — MIDAZOLAM HCL 2 MG/2ML IJ SOLN
INTRAMUSCULAR | Status: DC | PRN
  Administered 2022-04-09: 1 mg via INTRAVENOUS
  Administered 2022-04-09: .5 mg via INTRAVENOUS
  Administered 2022-04-09: 1 mg via INTRAVENOUS
  Administered 2022-04-09: 2 mg via INTRAVENOUS

## 2022-04-09 MED ORDER — FENTANYL CITRATE (PF) 100 MCG/2ML IJ SOLN
INTRAMUSCULAR | Status: DC | PRN
  Administered 2022-04-09 (×3): 50 ug via INTRAVENOUS
  Administered 2022-04-09: 25 ug via INTRAVENOUS

## 2022-04-09 MED ORDER — APIXABAN 2.5 MG PO TABS: 2.5000 mg | ORAL_TABLET | Freq: Two times a day (BID) | ORAL | 4 refills | Status: DC

## 2022-04-09 MED ORDER — HYDRALAZINE HCL 20 MG/ML IJ SOLN: 5.0000 mg | INTRAMUSCULAR | Status: DC | PRN

## 2022-04-09 MED ORDER — ONDANSETRON HCL 4 MG/2ML IJ SOLN: 4.0000 mg | Freq: Four times a day (QID) | INTRAMUSCULAR | Status: DC | PRN

## 2022-04-09 MED ORDER — FENTANYL CITRATE (PF) 100 MCG/2ML IJ SOLN
INTRAMUSCULAR | Status: AC
  Filled 2022-04-09: qty 2

## 2022-04-09 MED ORDER — MORPHINE SULFATE (PF) 4 MG/ML IV SOLN: 2.0000 mg | INTRAVENOUS | Status: DC | PRN

## 2022-04-09 MED ORDER — CLOPIDOGREL BISULFATE 75 MG PO TABS
ORAL_TABLET | ORAL | Status: AC
  Filled 2022-04-09: qty 3

## 2022-04-09 MED ORDER — APIXABAN 5 MG PO TABS
5.0000 mg | ORAL_TABLET | ORAL | Status: AC
  Administered 2022-04-09: 5 mg via ORAL
  Filled 2022-04-09: qty 1

## 2022-04-09 MED ORDER — HEPARIN SODIUM (PORCINE) 1000 UNIT/ML IJ SOLN
INTRAMUSCULAR | Status: AC
  Filled 2022-04-09: qty 10

## 2022-04-09 MED ORDER — IODIXANOL 320 MG/ML IV SOLN
INTRAVENOUS | Status: DC | PRN
  Administered 2022-04-09: 75 mL

## 2022-04-09 MED ORDER — FAMOTIDINE 20 MG PO TABS: 40.0000 mg | ORAL_TABLET | Freq: Once | ORAL | Status: DC | PRN

## 2022-04-09 MED ORDER — HYDROMORPHONE HCL 1 MG/ML IJ SOLN: 1.0000 mg | Freq: Once | INTRAMUSCULAR | Status: DC | PRN

## 2022-04-09 MED ORDER — CLOPIDOGREL BISULFATE 75 MG PO TABS
ORAL_TABLET | ORAL | Status: AC
  Administered 2022-04-09: 300 mg via ORAL
  Filled 2022-04-09: qty 1

## 2022-04-09 MED ORDER — MIDAZOLAM HCL 2 MG/ML PO SYRP: 8.0000 mg | ORAL_SOLUTION | Freq: Once | ORAL | Status: DC | PRN

## 2022-04-09 SURGICAL SUPPLY — 23 items
BALLN ARMADA 12X40X80 (BALLOONS) ×1
BALLN LUTONIX DCB 4X40X130 (BALLOONS) ×1
BALLOON ARMADA 12X40X80 (BALLOONS) IMPLANT
BALLOON LUTONIX DCB 4X40X130 (BALLOONS) IMPLANT
CATH ANGIO 5F PIGTAIL 100CM (CATHETERS) IMPLANT
CATH VERT 5FR 125CM (CATHETERS) IMPLANT
CATH VERT 5X100 (CATHETERS) IMPLANT
COVER PROBE ULTRASOUND 5X96 (MISCELLANEOUS) IMPLANT
DEVICE STARCLOSE SE CLOSURE (Vascular Products) IMPLANT
GLIDEWIRE ADV .035X260CM (WIRE) IMPLANT
KIT ENCORE 26 ADVANTAGE (KITS) IMPLANT
NDL ENTRY 21GA 7CM ECHOTIP (NEEDLE) IMPLANT
NEEDLE ENTRY 21GA 7CM ECHOTIP (NEEDLE) ×1 IMPLANT
PACK ANGIOGRAPHY (CUSTOM PROCEDURE TRAY) IMPLANT
SET INTRO CAPELLA COAXIAL (SET/KITS/TRAYS/PACK) IMPLANT
SHEATH BRITE TIP 5FRX11 (SHEATH) IMPLANT
SHEATH BRITE TIP 7FRX11 (SHEATH) IMPLANT
SHEATH SHUTTLE 7FR (SHEATH) IMPLANT
STENT LIFESTREAM 9X38X80 (Permanent Stent) IMPLANT
SYR MEDRAD MARK 7 150ML (SYRINGE) IMPLANT
TUBING CONTRAST HIGH PRESS 72 (TUBING) IMPLANT
WIRE GUIDERIGHT .035X150 (WIRE) IMPLANT
WIRE SUPRACORE 300CM (MISCELLANEOUS) IMPLANT

## 2022-04-09 NOTE — Progress Notes (Signed)
Pt sitting up and eating and drinking 
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
## 2022-04-09 NOTE — Interval H&P Note (Signed)
History and Physical Interval Note:  04/09/2022 2:13 PM  Darrell Schmidt  has presented today for surgery, with the diagnosis of L Arm Angio w subclavian stent    Subclavian steal syndrome.  The various methods of treatment have been discussed with the patient and family. After consideration of risks, benefits and other options for treatment, the patient has consented to  Procedure(s): Upper Extremity Angiography (Left) as a surgical intervention.  The patient's history has been reviewed, patient examined, no change in status, stable for surgery.  I have reviewed the patient's chart and labs.  Questions were answered to the patient's satisfaction.     [name]

## 2022-04-20 ENCOUNTER — Encounter (INDEPENDENT_AMBULATORY_CARE_PROVIDER_SITE_OTHER): Payer: Self-pay | Admitting: Vascular Surgery

## 2022-05-02 ENCOUNTER — Other Ambulatory Visit (INDEPENDENT_AMBULATORY_CARE_PROVIDER_SITE_OTHER): Payer: Self-pay | Admitting: Vascular Surgery

## 2022-05-02 DIAGNOSIS — Z95820 Peripheral vascular angioplasty status with implants and grafts: Secondary | ICD-10-CM

## 2022-05-02 DIAGNOSIS — G458 Other transient cerebral ischemic attacks and related syndromes: Secondary | ICD-10-CM

## 2022-05-06 ENCOUNTER — Ambulatory Visit (INDEPENDENT_AMBULATORY_CARE_PROVIDER_SITE_OTHER): Payer: Medicare Other | Admitting: Nurse Practitioner

## 2022-05-06 ENCOUNTER — Ambulatory Visit (INDEPENDENT_AMBULATORY_CARE_PROVIDER_SITE_OTHER): Payer: Medicare Other

## 2022-05-06 ENCOUNTER — Encounter (INDEPENDENT_AMBULATORY_CARE_PROVIDER_SITE_OTHER): Payer: Self-pay | Admitting: Nurse Practitioner

## 2022-05-06 VITALS — BP 155/77 | HR 75 | Resp 17 | Ht 70.0 in | Wt 243.0 lb

## 2022-05-06 DIAGNOSIS — Z95820 Peripheral vascular angioplasty status with implants and grafts: Secondary | ICD-10-CM

## 2022-05-06 DIAGNOSIS — I70213 Atherosclerosis of native arteries of extremities with intermittent claudication, bilateral legs: Secondary | ICD-10-CM

## 2022-05-06 DIAGNOSIS — I10 Essential (primary) hypertension: Secondary | ICD-10-CM | POA: Diagnosis not present

## 2022-05-06 DIAGNOSIS — G458 Other transient cerebral ischemic attacks and related syndromes: Secondary | ICD-10-CM | POA: Diagnosis not present

## 2022-05-21 ENCOUNTER — Encounter (INDEPENDENT_AMBULATORY_CARE_PROVIDER_SITE_OTHER): Payer: Self-pay | Admitting: Nurse Practitioner

## 2022-05-21 NOTE — Progress Notes (Signed)
Subjective:    Patient ID: Darrell Schmidt, male    DOB: [birth year], 61 y.o.   MRN: [id number] Chief Complaint  Patient presents with   Follow-up    ultrasound    Darrell Schmidt is a 61 year old male who returns today following intervention to his left upper extremity for subclavian steal syndrome including:  Procedure(s) Performed:             1.  Ultrasound guidance for vascular access right femoral artery femoral artery             2.  Catheter placement into left brachial artery from right femoral approach             3.  Aortogram and selective angiogram of the left arm third order catheter placement             4.  Percutaneous transluminal angioplasty of the left subclavian with a 4 mm diameter x 40 mm length Lutonix angioplasty balloon             5.  Stent to the left subclavian with 9 mm diameter x 38 mm length balloon expandable stent, a Lifestream with post dilation using an 12 mm balloon             6.  StarClose closure device right femoral artery   Patient notes that the claudication symptoms have resolved.  He notes that since beginning the Plavix he has had some headaches but typically these are tolerable.  He also complains of claudication-like symptoms in his lower extremities.  He notes that walking up inclines he has pain in his thighs and his calves.  He also has a history of lower back issues.  Today noninvasive studies show primarily triphasic waveforms throughout the left upper extremity    Review of Systems  Neurological:  Negative for dizziness and syncope.  All other systems reviewed and are negative.      Objective:   Physical Exam Vitals reviewed.  HENT:     Head: Normocephalic.  Cardiovascular:     Rate and Rhythm: Normal rate.     Pulses:          Radial pulses are 1+ on the right side and 1+ on the left side.  Pulmonary:     Effort: Pulmonary effort is normal.  Skin:    General: Skin is warm and dry.  Neurological:     Mental Status: He is  alert and oriented to person, place, and time.  Psychiatric:        Mood and Affect: Mood normal.        Behavior: Behavior normal.        Thought Content: Thought content normal.        Judgment: Judgment normal.     BP (!) 155/77 (BP Location: Right Arm)   Pulse 75   Resp 17   Ht _0  (1.778 m)   Wt 243 lb (110.2 kg)   BMI 34.87 kg/m   Past Medical History:  Diagnosis Date   Depression    History of kidney stones    Hypertension    Sleep apnea    some apnea, but is waiting on Cpap machine     Social History   Socioeconomic History   Marital status: Married    Spouse name: Not on file   Number of children: Not on file   Years of education: Not on file   Highest education level: Not on file  Occupational  History   Not on file  Tobacco Use   Smoking status: Former    Types: Cigarettes    Quit date: 05/27/2009    Years since quitting: 12.9   Smokeless tobacco: Never  Vaping Use   Vaping Use: Every day   Devices: with some nicotine   Substance and Sexual Activity   Alcohol use: Never   Drug use: Never   Sexual activity: Not on file  Other Topics Concern   Not on file  Social History Narrative   Not on file   Social Determinants of Health   Financial Resource Strain: Not on file  Food Insecurity: Not on file  Transportation Needs: Not on file  Physical Activity: Not on file  Stress: Not on file  Social Connections: Not on file  Intimate Partner Violence: Not on file    Past Surgical History:  Procedure Laterality Date   ANTERIOR CERVICAL DECOMP/DISCECTOMY FUSION N/A 09/20/2020   Procedure: ANTERIOR CERVICAL DECOMPRESSION/DISCECTOMY FUSION 1 LEVEL C6-7;  Surgeon: Meade Maw, MD;  Location: [facility];  Service: Neurosurgery;  Laterality: N/A;  1st case   BACK SURGERY  2015   lumbar Discectomy    BACK SURGERY  2016   Lumbar Discectomy    NOSE SURGERY  1980   nose surgery   1980   TOE SURGERY Right 2007   UPPER EXTREMITY ANGIOGRAPHY Left  04/09/2022   Procedure: Upper Extremity Angiography;  Surgeon: Katha Cabal, MD;  Location: [facility];  Service: Cardiovascular;  Laterality: Left;    History reviewed. No pertinent family history.  Allergies  Allergen Reactions   Aspirin Anaphylaxis   Ace Inhibitors Cough   Amlodipine Swelling   Atorvastatin     Other reaction(s): Muscle Pain   Rosuvastatin Other (See Comments)    Skin dryness, memory issues.       Latest Ref Rng & Units 09/13/2020   11:43 AM  CBC  WBC 4.0 - 10.5 K/uL 10.0   Hemoglobin 13.0 - 17.0 g/dL 13.5   Hematocrit 39.0 - 52.0 % 39.8   Platelets 150 - 400 K/uL 294       CMP     Component Value Date/Time   NA 139 09/13/2020 1143   K 3.9 09/13/2020 1143   CL 103 09/13/2020 1143   CO2 27 09/13/2020 1143   GLUCOSE 127 (H) 09/13/2020 1143   BUN 18 04/09/2022 1305   CREATININE 0.81 04/09/2022 1305   CALCIUM 9.3 09/13/2020 1143   GFRNONAA >60 04/09/2022 1305     No results found.     Assessment & Plan:   1. Subclavian steal syndrome Today patient symptoms of subclavian steal are improved.  The patient has essentially triphasic waveforms throughout the left upper extremity noting successful treatment.  Patient will continue with dual antiplatelet therapy. - VAS [place] UPPER EXTREMITY ARTERIAL DUPLEX  2. Atherosclerosis of native artery of both lower extremities with intermittent claudication [facility]) The patient does describe lower extremity claudication symptoms.  Based on this we will have the patient return with ABIs as well as an aortoiliac duplex.  Patient's symptoms may be related to his lower back issues, we will perform the studies at his 71-monthfollow-up.  3. Essential hypertension Continue antihypertensive medications as already ordered, these medications have been reviewed and there are no changes at this time.   Current Outpatient Medications on File Prior to Visit  Medication Sig Dispense Refill   apixaban (ELIQUIS)  2.5 MG TABS tablet Take 1 tablet (2.5  mg total) by mouth 2 (two) times daily. 60 tablet 4   clopidogrel (PLAVIX) 75 MG tablet Take 1 tablet (75 mg total) by mouth daily. 30 tablet 5   cyanocobalamin (VITAMIN B12) 1000 MCG/ML injection 1 shot every 2 weeks for 3 months and then monthly     DULoxetine (CYMBALTA) 30 MG capsule Take 30 mg by mouth 2 (two) times daily.     HYDROcodone-acetaminophen (NORCO/VICODIN) 5-325 MG tablet Take 1 tablet by mouth 3 (three) times daily as needed for pain.     losartan-hydrochlorothiazide (HYZAAR) 100-25 MG tablet Take 1 tablet by mouth daily.     pantoprazole (PROTONIX) 40 MG tablet Take 40 mg by mouth daily at 4 PM.     ramelteon (ROZEREM) 8 MG tablet SMARTSIG:1 Tablet(s) By Mouth Every Evening     rosuvastatin (CRESTOR) 5 MG tablet Take 5 mg by mouth daily.     tamsulosin (FLOMAX) 0.4 MG CAPS capsule Take 0.4 mg by mouth daily.     traMADol (ULTRAM) 50 MG tablet Take 50 mg by mouth 3 (three) times daily as needed for pain.     traMADol (ULTRAM) 50 MG tablet Take 1 tablet by mouth 3 (three) times daily.     chlorpheniramine-HYDROcodone (TUSSIONEX) 10-8 MG/5ML Take by mouth. (Patient not taking: Reported on 04/09/2022)     doxycycline (VIBRA-TABS) 100 MG tablet Take 100 mg by mouth 2 (two) times daily. (Patient not taking: Reported on 04/09/2022)     NARCAN 4 MG/0.1ML LIQD nasal spray kit Place 1 spray into the nose daily as needed for opioid reversal. (Patient not taking: Reported on 05/06/2022)     predniSONE (DELTASONE) 10 MG tablet Take by mouth. (Patient not taking: Reported on 04/09/2022)     tadalafil (CIALIS) 10 MG tablet Take 10 mg by mouth daily as needed for erectile dysfunction. (Patient not taking: Reported on 05/06/2022)     No current facility-administered medications on file prior to visit.    There are no Patient Instructions on file for this visit. No follow-ups on file.   Kris Hartmann, NP

## 2022-05-22 DIAGNOSIS — E538 Deficiency of other specified B group vitamins: Secondary | ICD-10-CM | POA: Diagnosis not present

## 2022-05-22 DIAGNOSIS — E782 Mixed hyperlipidemia: Secondary | ICD-10-CM | POA: Diagnosis not present

## 2022-05-22 DIAGNOSIS — Z125 Encounter for screening for malignant neoplasm of prostate: Secondary | ICD-10-CM | POA: Diagnosis not present

## 2022-05-23 DIAGNOSIS — F1190 Opioid use, unspecified, uncomplicated: Secondary | ICD-10-CM | POA: Diagnosis not present

## 2022-05-23 DIAGNOSIS — I771 Stricture of artery: Secondary | ICD-10-CM | POA: Diagnosis not present

## 2022-05-23 DIAGNOSIS — Z0000 Encounter for general adult medical examination without abnormal findings: Secondary | ICD-10-CM | POA: Diagnosis not present

## 2022-07-01 ENCOUNTER — Other Ambulatory Visit (INDEPENDENT_AMBULATORY_CARE_PROVIDER_SITE_OTHER): Payer: Self-pay | Admitting: Vascular Surgery

## 2022-07-22 DIAGNOSIS — E782 Mixed hyperlipidemia: Secondary | ICD-10-CM | POA: Diagnosis not present

## 2022-07-31 DIAGNOSIS — H47011 Ischemic optic neuropathy, right eye: Secondary | ICD-10-CM | POA: Diagnosis not present

## 2022-08-01 ENCOUNTER — Other Ambulatory Visit (INDEPENDENT_AMBULATORY_CARE_PROVIDER_SITE_OTHER): Payer: Self-pay | Admitting: Nurse Practitioner

## 2022-08-01 DIAGNOSIS — Z9889 Other specified postprocedural states: Secondary | ICD-10-CM

## 2022-08-01 DIAGNOSIS — I70213 Atherosclerosis of native arteries of extremities with intermittent claudication, bilateral legs: Secondary | ICD-10-CM

## 2022-08-03 NOTE — Progress Notes (Signed)
MRN : [id number]  Darrell Schmidt is a 62 y.o. (1960-07-04) male who presents with chief complaint of check circulation.  History of Present Illness:   The patient returns to the office for followup and review status post angiogram with intervention on 04/09/2022.   Procedure:  Percutaneous transluminal angioplasty of the left subclavian with a 4 mm diameter x 40 mm length Lutonix angioplasty balloon 2.    Stent to the left subclavian with 9 mm diameter x 38 mm length balloon expandable stent, a Lifestream with post dilation using an 12 mm balloon  The patient notes improvement in the upper extremity symptoms. No interval development of claudication or rest pain symptoms. No new ulcers or wounds have occurred since the last visit.  The patient is also seen for evaluation of painful lower extremities. Patient notes the pain is variable and not always associated with activity.  The pain is somewhat consistent day to day occurring on most days. The patient notes the pain also occurs with standing and routinely seems worse as the day wears on. The pain has been progressive over the past several years. The patient states these symptoms are causing  a negative impact on quality of life and daily activities which was a factor in the referral.  The patient has a  history of back problems and DJD of the lumbar and sacral spine.   There have been no significant changes to the patient's overall health care.  No documented history of amaurosis fugax or recent TIA symptoms. There are no recent neurological changes noted. No documented history of DVT, PE or superficial thrombophlebitis. The patient denies recent episodes of angina or shortness of breath.   ABI's Rt=1.09 and Lt=1.07  (Triphasic signals bilaterally).  Duplex US of the aorta iliac arteries is negative for aneurysm or hemodynamically significant stenosis.  Duplex ultrasound of the upper extremity arterial system  shows normal velocities (systolic pressures documented in the ABI are identical)    No outpatient medications have been marked as taking for the 08/05/22 encounter (Appointment) with [name], [name], MD.    Past Medical History:  Diagnosis Date   Depression    History of kidney stones    Hypertension    Sleep apnea    some apnea, but is waiting on Cpap machine     Past Surgical History:  Procedure Laterality Date   ANTERIOR CERVICAL DECOMP/DISCECTOMY FUSION N/A 09/20/2020   Procedure: ANTERIOR CERVICAL DECOMPRESSION/DISCECTOMY FUSION 1 LEVEL C6-7;  Surgeon: Meade Maw, MD;  Location: [facility];  Service: Neurosurgery;  Laterality: N/A;  1st case   BACK SURGERY  2015   lumbar Discectomy    BACK SURGERY  2016   Lumbar Discectomy    NOSE SURGERY  1980   nose surgery   1980   TOE SURGERY Right 2007   UPPER EXTREMITY ANGIOGRAPHY Left 04/09/2022   Procedure: Upper Extremity Angiography;  Surgeon: Katha Cabal, MD;  Location: [place] [facility];  Service: Cardiovascular;  Laterality: Left;    Social History Social History   Tobacco Use   Smoking status: Former    Types: Cigarettes    Quit date: 05/27/2009    Years since quitting: 13.1   Smokeless tobacco: Never  Vaping Use   Vaping Use: Every day   Devices: with some nicotine   Substance Use Topics   Alcohol use: Never   Drug use: Never  Family History No family history on file.  Allergies  Allergen Reactions   Aspirin Anaphylaxis   Ace Inhibitors Cough   Amlodipine Swelling   Atorvastatin     Other reaction(s): Muscle Pain   Rosuvastatin Other (See Comments)    Skin dryness, memory issues.     REVIEW OF SYSTEMS (Negative unless checked)  Constitutional: '[]'$ Weight loss  '[]'$ Fever  '[]'$ Chills Cardiac: '[]'$ Chest pain   '[]'$ Chest pressure   '[]'$ Palpitations   '[]'$ Shortness of breath when laying flat   '[]'$ Shortness of breath with exertion. Vascular:  '[x]'$ Pain in legs with walking   '[]'$ Pain in legs at rest   '[]'$ History of DVT   '[]'$ Phlebitis   '[]'$ Swelling in legs   '[]'$ Varicose veins   '[]'$ Non-healing ulcers Pulmonary:   '[]'$ Uses home oxygen   '[]'$ Productive cough   '[]'$ Hemoptysis   '[]'$ Wheeze  '[]'$ COPD   '[]'$ Asthma Neurologic:  '[]'$ Dizziness   '[]'$ Seizures   '[]'$ History of stroke   '[]'$ History of TIA  '[]'$ Aphasia   '[]'$ Vissual changes   '[]'$ Weakness or numbness in arm   '[]'$ Weakness or numbness in leg Musculoskeletal:   '[]'$ Joint swelling   '[]'$ Joint pain   '[]'$ Low back pain Hematologic:  '[]'$ Easy bruising  '[]'$ Easy bleeding   '[]'$ Hypercoagulable state   '[]'$ Anemic Gastrointestinal:  '[]'$ Diarrhea   '[]'$ Vomiting  '[]'$ Gastroesophageal reflux/heartburn   '[]'$ Difficulty swallowing. Genitourinary:  '[]'$ Chronic kidney disease   '[]'$ Difficult urination  '[]'$ Frequent urination   '[]'$ Blood in urine Skin:  '[]'$ Rashes   '[]'$ Ulcers  Psychological:  '[]'$ History of anxiety   '[]'$  History of major depression.  Physical Examination  There were no vitals filed for this visit. There is no height or weight on file to calculate BMI. Gen: WD/WN, NAD Head: [place]/AT, No temporalis wasting.  Ear/Nose/Throat: Hearing grossly intact, nares w/o erythema or drainage Eyes: PER, EOMI, sclera nonicteric.  Neck: Supple, no masses.  No bruit or JVD.  Pulmonary:  Good air movement, no audible wheezing, no use of accessory muscles.  Cardiac: RRR, normal S1, S2, no Murmurs. Vascular:  no open wounds Vessel Right Left  Radial Palpable Palpable  PT  Palpable  Palpable  DP  Palpable  Palpable  Gastrointestinal: soft, non-distended. No guarding/no peritoneal signs.  Musculoskeletal: M/S 5/5 throughout.  No visible deformity.  Neurologic: CN 2-12 intact. Pain and light touch intact in extremities.  Symmetrical.  Speech is fluent. Motor exam as listed above. Psychiatric: Judgment intact, Mood & affect appropriate for pt's clinical situation. Dermatologic: No rashes or ulcers noted.  No changes consistent with cellulitis.   CBC Lab Results  Component Value Date   WBC 10.0 09/13/2020   HGB 13.5  09/13/2020   HCT 39.8 09/13/2020   MCV 90.5 09/13/2020   PLT 294 09/13/2020    BMET    Component Value Date/Time   NA 139 09/13/2020 1143   K 3.9 09/13/2020 1143   CL 103 09/13/2020 1143   CO2 27 09/13/2020 1143   GLUCOSE 127 (H) 09/13/2020 1143   BUN 18 04/09/2022 1305   CREATININE 0.81 04/09/2022 1305   CALCIUM 9.3 09/13/2020 1143   GFRNONAA >60 04/09/2022 1305   CrCl cannot be calculated (Patient's most recent lab result is older than the maximum 21 days allowed.).  COAG Lab Results  Component Value Date   INR 0.9 09/13/2020    Radiology No results found.   Assessment/Plan 1. Subclavian artery stenosis, left (HCC) Today patient symptoms of subclavian steal are improved. The patient has triphasic waveforms throughout the left upper extremity indicating successful treatment. His systolic pressures on his  ABI's are identical.  Patient can change to single antiplatelet therapy. He will stop Eliquis.  - VAS [place] UPPER EXTREMITY ARTERIAL DUPLEX; Future  2. Bilateral carotid artery stenosis Recommend:  Given the patient's asymptomatic subcritical stenosis no further invasive testing or surgery at this time.  Duplex ultrasound shows <50% stenosis bilaterally.  Continue antiplatelet therapy as prescribed Continue management of CAD, HTN and Hyperlipidemia Healthy heart diet,  encouraged exercise at least 4 times per week Follow up in 24 months with duplex ultrasound and physical exam   3. Subclavian steal syndrome See #1  - VAS [place] UPPER EXTREMITY ARTERIAL DUPLEX; Future  4. Atherosclerosis of native artery of both lower extremities with intermittent claudication (HCC) Recommend:  I do not find evidence of life style limiting vascular disease. The patient specifically denies life style limitation.  Previous noninvasive studies including ABI's of the legs do not identify critical vascular problems.  The patient should continue walking and begin a more formal exercise  program. The patient should continue his antiplatelet therapy and aggressive treatment of the lipid abnormalities.  The patient is instructed to call the office if there is a significant change in the lower extremity symptoms, particularly if a wound develops or there is an abrupt increase in leg pain.   5. Essential hypertension Continue antihypertensive medications as already ordered, these medications have been reviewed and there are no changes at this time.  6. Hyperlipidemia, mixed Continue statin as ordered and reviewed, no changes at this time   Hortencia Pilar, MD  08/03/2022 2:06 PM

## 2022-08-05 ENCOUNTER — Ambulatory Visit (INDEPENDENT_AMBULATORY_CARE_PROVIDER_SITE_OTHER): Payer: Medicare Other | Admitting: Vascular Surgery

## 2022-08-05 ENCOUNTER — Ambulatory Visit (INDEPENDENT_AMBULATORY_CARE_PROVIDER_SITE_OTHER): Payer: Medicare Other

## 2022-08-05 ENCOUNTER — Encounter (INDEPENDENT_AMBULATORY_CARE_PROVIDER_SITE_OTHER): Payer: Self-pay | Admitting: Vascular Surgery

## 2022-08-05 VITALS — BP 138/80 | HR 76 | Resp 18 | Ht 70.5 in | Wt 240.0 lb

## 2022-08-05 DIAGNOSIS — I10 Essential (primary) hypertension: Secondary | ICD-10-CM | POA: Diagnosis not present

## 2022-08-05 DIAGNOSIS — I6523 Occlusion and stenosis of bilateral carotid arteries: Secondary | ICD-10-CM | POA: Diagnosis not present

## 2022-08-05 DIAGNOSIS — Z9889 Other specified postprocedural states: Secondary | ICD-10-CM | POA: Diagnosis not present

## 2022-08-05 DIAGNOSIS — I739 Peripheral vascular disease, unspecified: Secondary | ICD-10-CM

## 2022-08-05 DIAGNOSIS — I70213 Atherosclerosis of native arteries of extremities with intermittent claudication, bilateral legs: Secondary | ICD-10-CM

## 2022-08-05 DIAGNOSIS — I771 Stricture of artery: Secondary | ICD-10-CM

## 2022-08-05 DIAGNOSIS — E782 Mixed hyperlipidemia: Secondary | ICD-10-CM

## 2022-08-05 DIAGNOSIS — G458 Other transient cerebral ischemic attacks and related syndromes: Secondary | ICD-10-CM | POA: Diagnosis not present

## 2022-08-05 MED ORDER — CLOPIDOGREL BISULFATE 75 MG PO TABS: 75.0000 mg | ORAL_TABLET | Freq: Every day | ORAL | 11 refills | Status: AC

## 2022-08-14 DIAGNOSIS — H2513 Age-related nuclear cataract, bilateral: Secondary | ICD-10-CM | POA: Diagnosis not present

## 2022-08-14 DIAGNOSIS — H47011 Ischemic optic neuropathy, right eye: Secondary | ICD-10-CM | POA: Diagnosis not present

## 2022-08-16 IMAGING — MG DIGITAL DIAGNOSTIC BILAT W/ TOMO W/ CAD
6 of 12 series · 6 of 36 positions shown · non-contrast
Comparison: Previous exam(s).

CLINICAL DATA: Palpable lump right retroareolar region.

EXAM:
DIGITAL DIAGNOSTIC BILATERAL MAMMOGRAM WITH TOMOSYNTHESIS AND CAD
TECHNIQUE: Bilateral digital diagnostic mammography and breast tomosynthesis
was performed. The images were evaluated with computer-aided
detection.

[L MLO synth-2D (1 of 2)]
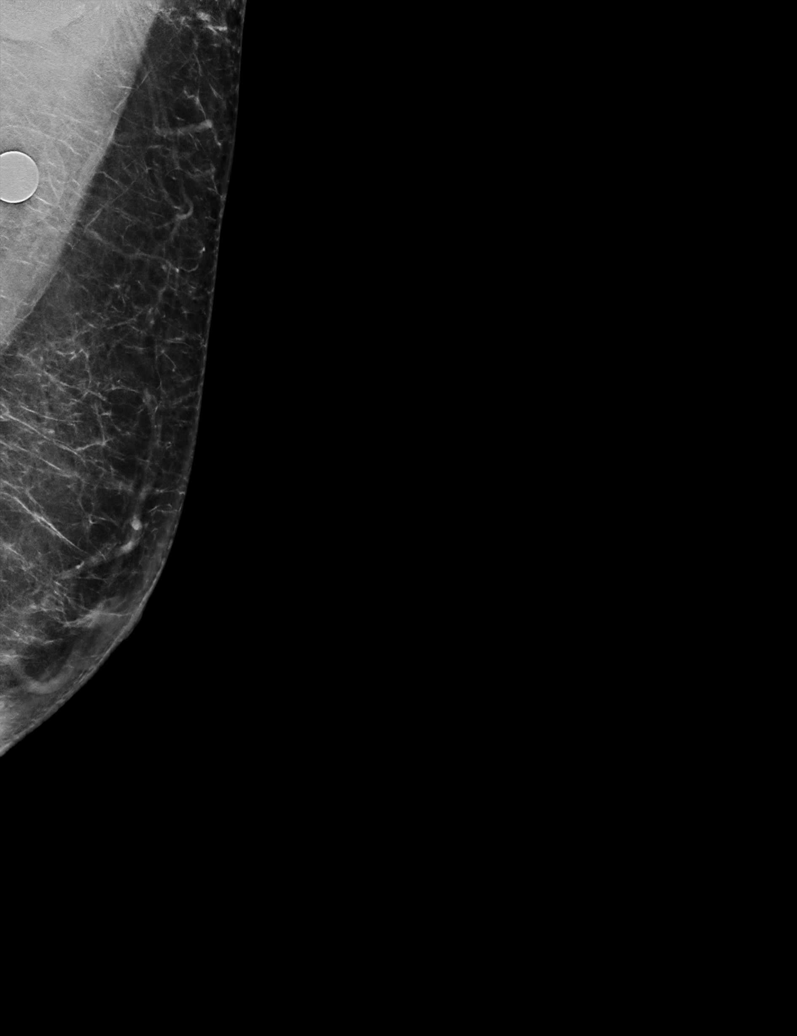

[R TAN synth-2D]
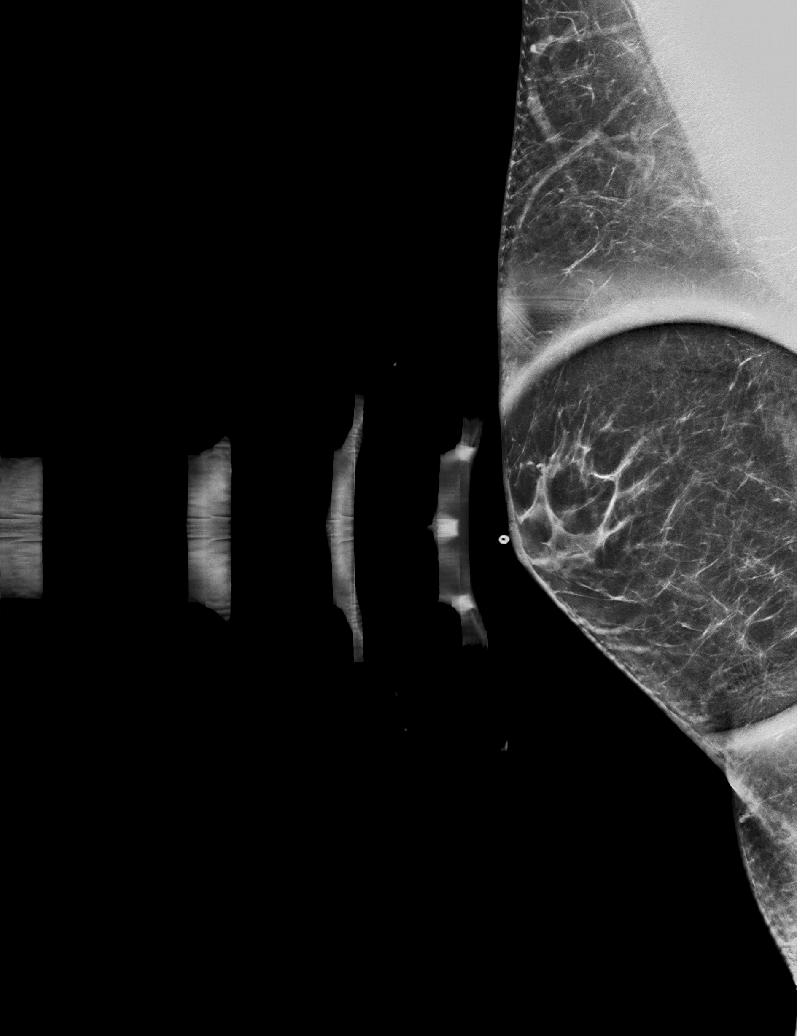

[R MLO synth-2D]
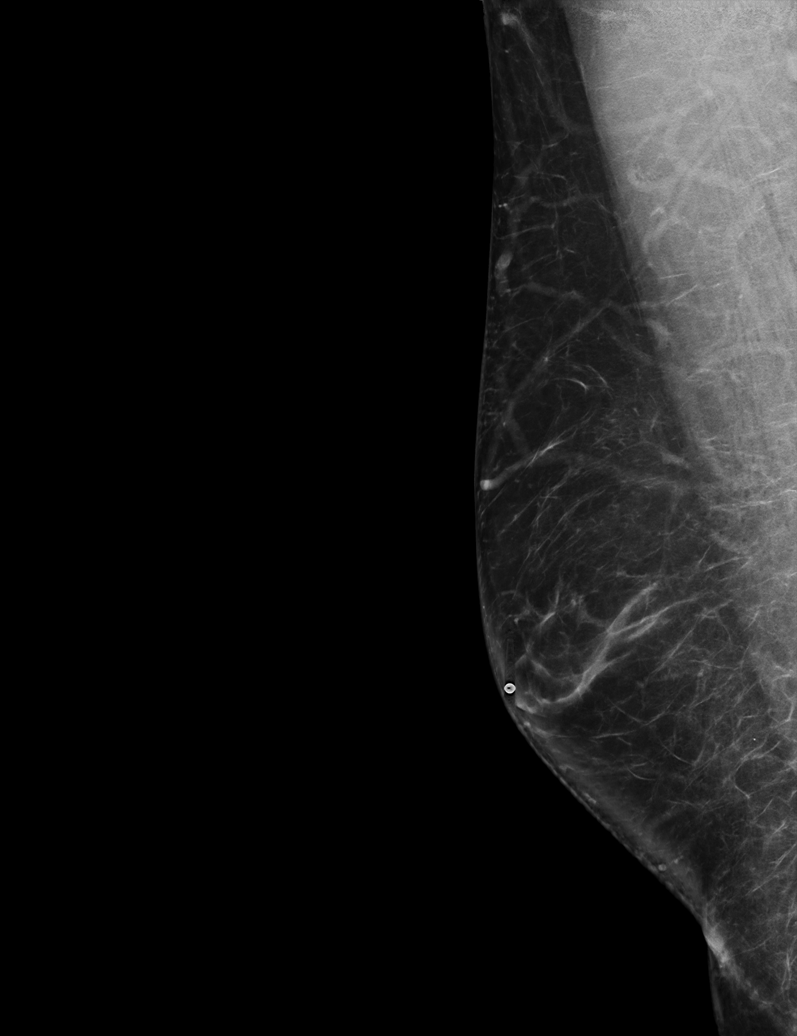

[L CC synth-2D]
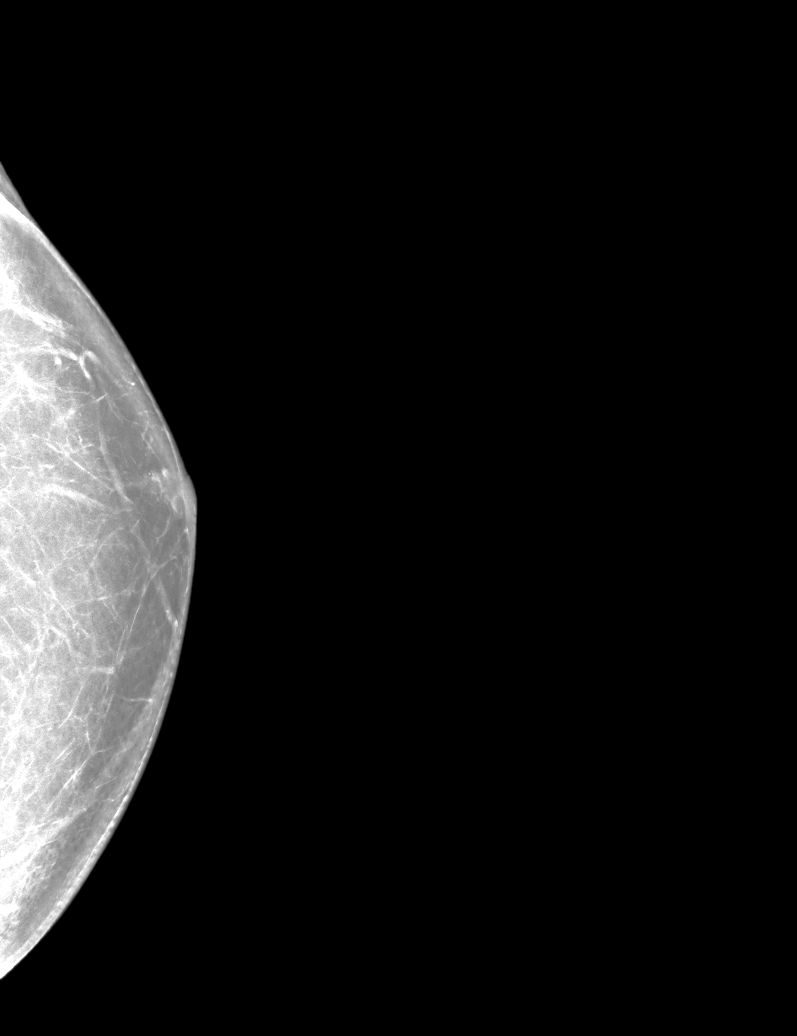

[R CC synth-2D]
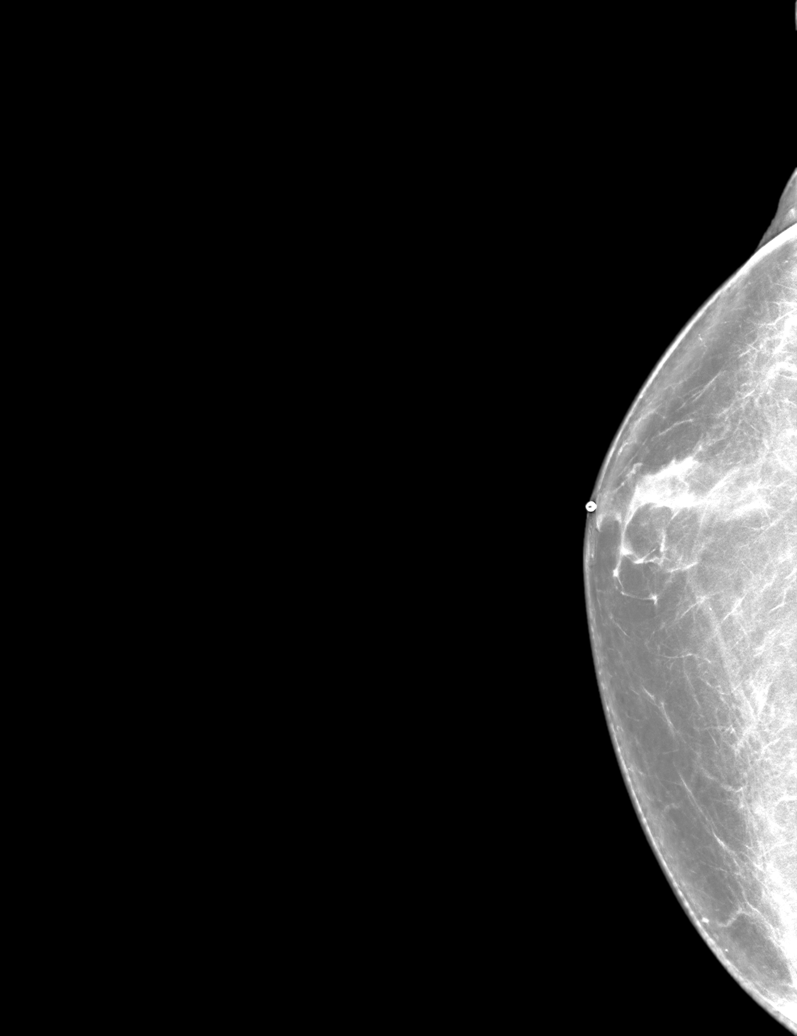

[L MLO synth-2D (2 of 2)]
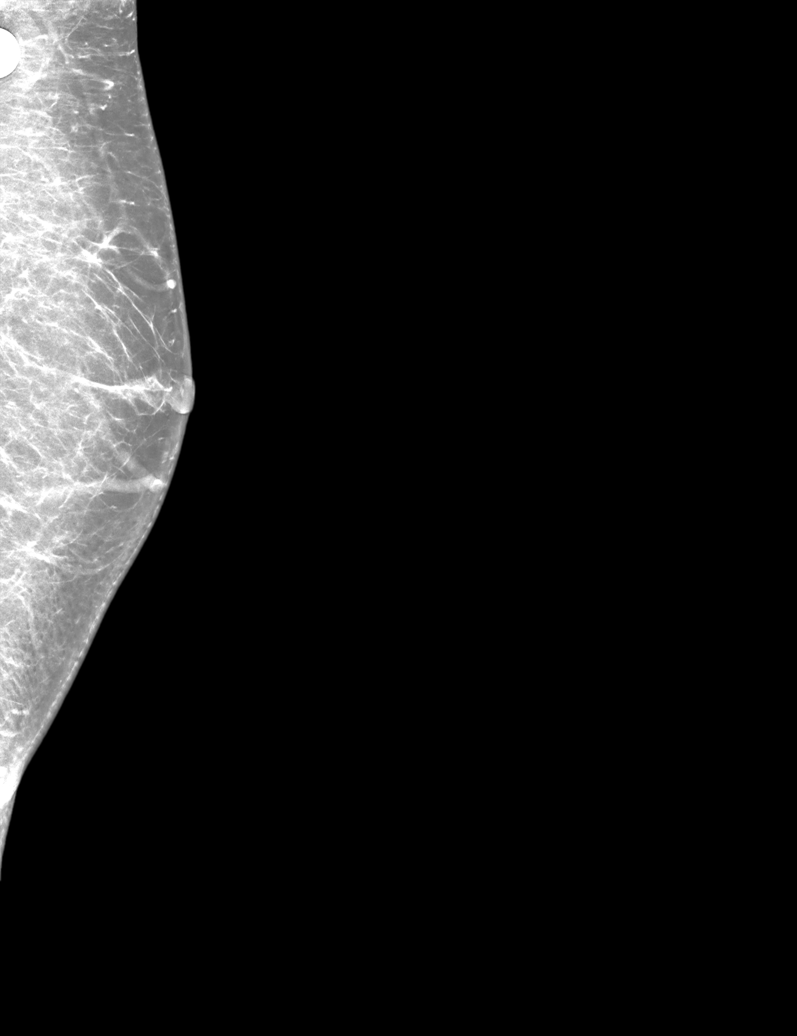

[6 of 36 positions shown; findings below may reference images not displayed]

ACR Breast Density Category b: There are scattered areas of
fibroglandular density.
FINDINGS: The palpable lump correlates with gynecomastia. No other suspicious
findings in either breast.
IMPRESSION: Patient is palpating right-sided gynecomastia. No evidence of
malignancy.

RECOMMENDATION:
Treatment of the patient's gynecomastia should be based on clinical
exam.

I have discussed the findings and recommendations with the patient.
If applicable, a reminder letter will be sent to the patient
regarding the next appointment.

BI-RADS CATEGORY  2: Benign.

## 2023-07-30 NOTE — Progress Notes (Deleted)
 MRN : [id number]  Darrell Schmidt is a 63 y.o. (Oct 08, 1960) male who presents with chief complaint of check circulation.  History of Present Illness:  The patient returns to the office for followup and review status post angiogram with intervention on 04/09/2022.    Procedure:  Percutaneous transluminal angioplasty of the left subclavian with a 4 mm diameter x 40 mm length Lutonix angioplasty balloon 2.    Stent to the left subclavian with 9 mm diameter x 38 mm length balloon expandable stent, a Lifestream with post dilation using an 12 mm balloon   The patient notes improvement in the upper extremity symptoms. No interval development of claudication or rest pain symptoms. No new ulcers or wounds have occurred since the last visit.   The patient is also seen for evaluation of painful lower extremities. Patient notes the pain is variable and not always associated with activity.  The pain is somewhat consistent day to day occurring on most days. The patient notes the pain also occurs with standing and routinely seems worse as the day wears on. The pain has been progressive over the past several years. The patient states these symptoms are causing  a negative impact on quality of life and daily activities which was a factor in the referral.   The patient has a  history of back problems and DJD of the lumbar and sacral spine.    There have been no significant changes to the patient's overall health care.   No documented history of amaurosis fugax or recent TIA symptoms. There are no recent neurological changes noted. No documented history of DVT, PE or superficial thrombophlebitis. The patient denies recent episodes of angina or shortness of breath.    ABI's Rt=1.09 and Lt=1.07  (Triphasic signals bilaterally).   Duplex US of the aorta iliac arteries is negative for aneurysm or hemodynamically significant stenosis.    Duplex ultrasound of the upper extremity arterial system shows normal velocities (systolic pressures documented in the ABI are identical)     No outpatient medications have been marked as taking for the 08/04/23 encounter (Appointment) with Gilda Crease, Latina Craver, MD.    Past Medical History:  Diagnosis Date   Depression    History of kidney stones    Hypertension    Sleep apnea    some apnea, but is waiting on Cpap machine     Past Surgical History:  Procedure Laterality Date   ANTERIOR CERVICAL DECOMP/DISCECTOMY FUSION N/A 09/20/2020   Procedure: ANTERIOR CERVICAL DECOMPRESSION/DISCECTOMY FUSION 1 LEVEL C6-7;  Surgeon: Venetia Night, MD;  Location: [facility] ORS;  Service: Neurosurgery;  Laterality: N/A;  1st case   BACK SURGERY  2015   lumbar Discectomy    BACK SURGERY  2016   Lumbar Discectomy    NOSE SURGERY  1980   nose surgery   1980   TOE SURGERY Right 2007   UPPER EXTREMITY ANGIOGRAPHY Left 04/09/2022   Procedure: Upper Extremity Angiography;  Surgeon: Renford Dills, MD;  Location: [facility];  Service: Cardiovascular;  Laterality: Left;    Social History Social History  Tobacco Use   Smoking status: Former    Current packs/day: 0.00    Types: Cigarettes    Quit date: 05/27/2009    Years since quitting: 14.1   Smokeless tobacco: Never  Vaping Use   Vaping status: Every Day   Devices: with some nicotine   Substance Use Topics   Alcohol use: Never   Drug use: Never    Family History No family history on file.  Allergies  Allergen Reactions   Aspirin Anaphylaxis   Ace Inhibitors Cough   Amlodipine Swelling   Atorvastatin     Other reaction(s): Muscle Pain   Rosuvastatin Other (See Comments)    Skin dryness, memory issues.     REVIEW OF SYSTEMS (Negative unless checked)  Constitutional: [] Weight loss  [] Fever  [] Chills Cardiac: [] Chest pain   [] Chest pressure   [] Palpitations   [] Shortness of breath when laying flat   [] Shortness of  breath with exertion. Vascular:  [x] Pain in legs with walking   [] Pain in legs at rest  [] History of DVT   [] Phlebitis   [] Swelling in legs   [] Varicose veins   [] Non-healing ulcers Pulmonary:   [] Uses home oxygen   [] Productive cough   [] Hemoptysis   [] Wheeze  [] COPD   [] Asthma Neurologic:  [] Dizziness   [] Seizures   [] History of stroke   [] History of TIA  [] Aphasia   [] Vissual changes   [] Weakness or numbness in arm   [] Weakness or numbness in leg Musculoskeletal:   [] Joint swelling   [] Joint pain   [] Low back pain Hematologic:  [] Easy bruising  [] Easy bleeding   [] Hypercoagulable state   [] Anemic Gastrointestinal:  [] Diarrhea   [] Vomiting  [] Gastroesophageal reflux/heartburn   [] Difficulty swallowing. Genitourinary:  [] Chronic kidney disease   [] Difficult urination  [] Frequent urination   [] Blood in urine Skin:  [] Rashes   [] Ulcers  Psychological:  [] History of anxiety   []  History of major depression.  Physical Examination  There were no vitals filed for this visit. There is no height or weight on file to calculate BMI. Gen: WD/WN, NAD Head: [place]/AT, No temporalis wasting.  Ear/Nose/Throat: Hearing grossly intact, nares w/o erythema or drainage Eyes: PER, EOMI, sclera nonicteric.  Neck: Supple, no masses.  No bruit or JVD.  Pulmonary:  Good air movement, no audible wheezing, no use of accessory muscles.  Cardiac: RRR, normal S1, S2, no Murmurs. Vascular:  mild trophic changes, no open wounds Vessel Right Left  Radial Palpable Palpable  PT Not Palpable Not Palpable  DP Not Palpable Not Palpable  Gastrointestinal: soft, non-distended. No guarding/no peritoneal signs.  Musculoskeletal: M/S 5/5 throughout.  No visible deformity.  Neurologic: CN 2-12 intact. Pain and light touch intact in extremities.  Symmetrical.  Speech is fluent. Motor exam as listed above. Psychiatric: Judgment intact, Mood & affect appropriate for pt's clinical situation. Dermatologic: No rashes or ulcers noted.  No  changes consistent with cellulitis.   CBC Lab Results  Component Value Date   WBC 10.0 09/13/2020   HGB 13.5 09/13/2020   HCT 39.8 09/13/2020   MCV 90.5 09/13/2020   PLT 294 09/13/2020    BMET    Component Value Date/Time   NA 139 09/13/2020 1143   K 3.9 09/13/2020 1143   CL 103 09/13/2020 1143   CO2 27 09/13/2020 1143   GLUCOSE 127 (H) 09/13/2020 1143   BUN 18 04/09/2022 1305   CREATININE 0.81 04/09/2022 1305   CALCIUM 9.3 09/13/2020 1143   GFRNONAA >60 04/09/2022 1305  CrCl cannot be calculated (Patient's most recent lab result is older than the maximum 21 days allowed.).  COAG Lab Results  Component Value Date   INR 0.9 09/13/2020    Radiology No results found.   Assessment/Plan There are no diagnoses linked to this encounter.   [name], MD  07/30/2023 1:12 PM

## 2023-08-04 ENCOUNTER — Other Ambulatory Visit (INDEPENDENT_AMBULATORY_CARE_PROVIDER_SITE_OTHER): Payer: Medicare Other

## 2023-08-04 ENCOUNTER — Ambulatory Visit (INDEPENDENT_AMBULATORY_CARE_PROVIDER_SITE_OTHER): Payer: Medicare Other | Admitting: Vascular Surgery

## 2023-08-04 DIAGNOSIS — I10 Essential (primary) hypertension: Secondary | ICD-10-CM

## 2023-08-04 DIAGNOSIS — E782 Mixed hyperlipidemia: Secondary | ICD-10-CM

## 2023-08-04 DIAGNOSIS — I6523 Occlusion and stenosis of bilateral carotid arteries: Secondary | ICD-10-CM

## 2023-08-04 DIAGNOSIS — I771 Stricture of artery: Secondary | ICD-10-CM

## 2023-08-04 DIAGNOSIS — I70213 Atherosclerosis of native arteries of extremities with intermittent claudication, bilateral legs: Secondary | ICD-10-CM

## 2023-10-14 ENCOUNTER — Encounter (INDEPENDENT_AMBULATORY_CARE_PROVIDER_SITE_OTHER): Payer: Self-pay

## 2023-11-24 NOTE — Progress Notes (Signed)
 Medicare Wellness Visit   Providers Rendering Care Dr. Oneil Miller-Internal Medicine  Functional Assessment (1) Hearing: Demonstrates no difficulty in hearing during normal conversation (2) Risk of Falls: Patient denies any falls or near falls in the last year (3) Home Safety: Patient feels secure in their home. There are operational smoke alarms in multiple areas of the home. (4) Activities of Daily Living: Independently manages personal grooming and household chores, including cooking, cleaning and laundry.  Manages Personal finances without assistance.    Depression Screening PHQ 2/9 last 3 flowsheet values     11/19/2022    9:13 AM 05/27/2023   11:24 AM 11/24/2023    8:59 AM  PHQ-2/9 Depression Screening   Little interest or pleasure in doing things 0 0 0  Feeling down, depressed, or hopeless 1 0 0  Patient Health Questionnaire-2 Score 1 * 0 * 0  How difficult have these problems made it for you to do your work, take care of things at home, or get along with other people? Somewhat difficult      * Data saved with a previous flowsheet row definition     Depression Severity and Treatment Recommendations:  0-4= None  5-9= Mild / Treatment: Support, educate to call if worse; return in one month  10-14= Moderate / Treatment: Support, watchful waiting; Antidepressant or Psychotherapy  15-19= Moderately severe / Treatment: Antidepressant OR Psychotherapy  >= 20 = Major depression, severe / Antidepressant AND Psychotherapy   Cognitive impairment Oriented to person, place and time.  Responses appear appropriate and timely to this observer.   Prevention Plan  Item name                              Frequency        Month Due       Year Due Health Maintenance  Topic Date Due  . Diabetes Education  Never done  . HIV Screen  Never done  . Hepatitis C Screen  Never done  . Monofilament Foot Exam  Never done  . Annual Urine Albumin Creatinine Ratio  Never done  . Pneumococcal  Vaccine: 50+ (1 of 2 - PCV) Never done  . Shingrix (1 of 2) Never done  . Lung Cancer Screening  Never done  . RSV Immunization Pregnant or 60+ (1 - Risk 60-74 years 1-dose series) Never done  . Colorectal Cancer Screening  05/31/2022  . COVID-19 Vaccine (1 - 2024-25 season) 01/26/2023  . Hemoglobin A1C  05/18/2024  . Annual Physical/Well Child Check  05/27/2024  . Diabetes Eye Assessment Exam  08/14/2024  . Creatinine Level  11/16/2024  . Potassium Level  11/16/2024  . Lipid Panel  11/16/2024  . Serum Calcium  11/16/2024  . Depression Screening  11/23/2024  . Medicare Subsequent AWV [id number]  11/24/2024  . Adult Tetanus (Td And Tdap)  07/24/2025  . PSA  11/16/2025  . Influenza Vaccine  Completed  . Hib Vaccines  Aged Out  . Hepatitis A Vaccines  Aged Out  . Meningococcal B Vaccine  Aged Out  . Meningococcal ACWY Vaccine  Aged Out  . HPV Vaccines  Aged Out    Other personalized health advice Encouraged patient to exercise regularly.  Encouraged attention to diet with good intake of fruits, vegetables, and limitation of red meat to 2 times a week or less    End of Life Counseling Patient has a living will in place.  Patient is  a full code.     *Some images could not be shown.

## 2023-11-24 NOTE — Progress Notes (Signed)
 Patient Profile:   Darrell Schmidt  is a 63 y.o.  male Chief Complaint  Patient presents with  . Follow-up    States he is not taking Neurontin. He didn't feel it helped.      PROBLEM LIST: Past Medical History:  Diagnosis Date  . Arthritis   . Cervical disc disease 10/10/2013  . Cervicalgia    chronic  . GERD (gastroesophageal reflux disease)   . Hyperlipidemia   . Hypertension   . NAION (non-arteritic anterior ischemic optic neuropathy), right   . OSA on CPAP 06/14/2020  . Statin intolerance 04/03/2022    Past Surgical History:  Procedure Laterality Date  . COLONOSCOPY  2014   DR. [place]  . LAMINECTOMY POSTERIOR LUMBAR FACETECTOMY & FORAMINOTOMY W/DECOMP Bilateral 11/12/2013   Procedure: BILATERAL LUMBAR DECOMPRESSION L4-5, L5-S1;  Surgeon: Debby Lorrene Kaufmann, MD;  Location: [facility] OR;  Service: Orthopedics;  Laterality: Bilateral;  . LAMINECTOMY POSTERIOR CERVICLE DECOMP W/FACETECTOMY & FORAMINOTOMY Bilateral 11/12/2013   Procedure: 36951 X 2;  Surgeon: Debby Lorrene Kaufmann, MD;  Location[facility];  Service: Orthopedics;  Laterality: Bilateral;  . LAMINOTOMY REEXPLORATION POSTERIOR LUMBAR W/NERVE DECOMP & DISCECTOMY Left 11/29/2014   Procedure: REEXPLORATION LUMBAR DECOMPRESSION L4-5 CENTRAL/LEFT, ;  Surgeon: Debby Lorrene Kaufmann, MD;  Location[facility];  Service: Orthopedics;  Laterality: Left;  . LAMINOTOMY REEXPLORATION POSTERIOR LUMBAR W/NERVE DECOMP & DISCECTOMY Left 11/29/2014   Procedure: LAMINOTOMY REEXPLORATION EACH ADDITIONAL LUMBAR INTERSPACE;  Surgeon: Debby Lorrene Kaufmann, MD;  Location: [facility] OR;  Service: Orthopedics;  Laterality: Left;  . POSTERIOR LUMBAR SPINE FUSION ONE LEVEL Bilateral 04/07/2015   Procedure: BILATERAL EXTENSIVE RE-EXPLORATION LUMBAR DECOMPRESSION, STABILIZATION, SPINAL FUSION L4-5; RIGHT ICBG;  Surgeon: Debby Lorrene Kaufmann, MD;  Location: [facility] OR;  Service: Orthopedics;  Laterality: Bilateral;  . LAMINOTOMY  REEXPLORATION POSTERIOR LUMBAR W/NERVE DECOMP & DISCECTOMY Bilateral 04/07/2015   Procedure: LAMINOTOMY REEXPLORATION POSTERIOR LUMBAR W/NERVE DECOMP & DISCECTOMY; [id number];  Surgeon: Debby Lorrene Kaufmann, MD;  Location: [facility] OR;  Service: Orthopedics;  Laterality: Bilateral;  . LAMINOTOMY REEXPLORATION POSTERIOR LUMBAR W/NERVE DECOMP & DISCECTOMY Bilateral 04/07/2015   Procedure: LAMINOTOMY REEXPLORATION EACH ADDITIONAL LUMBAR INTERSPACE; 36955-49;  Surgeon: Debby Lorrene Kaufmann, MD;  Location: [facility] OR;  Service: Orthopedics;  Laterality: Bilateral;  . AUTOGRAFT STRUCTURAL ICBG OBTAINED SEPARATE INCISION FOR SPINE SURGERY Right 04/07/2015   Procedure: AUTOGRAFT FOR SPINE SURGERY ONLY (INCL HARVESTING GRAFT); STRUCTURAL, BICORTICAL OR TRICORTICAL (THROUGH SEPARATE SKIN OR FASCIAL INCISION) (LIST IN ADDITION TO PRIMARY PROCEDURE);  Surgeon: Debby Lorrene Kaufmann, MD;  Location: [facility] OR;  Service: Orthopedics;  Laterality: Right;  . IMPLANTATION PERCUTANEOUS EPIDURAL NEUROSTIMULATORY ELECTRODES N/A 08/12/2016   Procedure: spinal cord stimulator trial;  Surgeon: Laurita Marijean Colla, MD;  Location: [facility] OR;  Service: Anes/ Pain Mgmt;  Laterality: N/A;  . C6-7 ANTERIOR CERVICAL DECOMPRESSION/DISCECTOMY FUSION  09/20/2020   Dr. Reeves Daisy at [facility] [place]  . NASAL ENDOSCOPY N/A   . Nasal surgery    . OSTEOTOMY TOE    . OTHER SURGERY     Stent Placement-  subclavin artery Early November 2023.  . Radiofrequency ablation of the neck    . SPINE SURGERY      ALLERGIES: Allergies  Allergen Reactions  . Aspirin  Anaphylaxis  . Ace Inhibitors Cough  . Amlodipine Swelling  . Lipitor [Atorvastatin] Muscle Pain    CURRENT MEDICATIONS: Current Outpatient Medications  Medication Sig Dispense Refill  . clopidogreL  (PLAVIX ) 75 mg tablet Take 1 tablet (75 mg total) by mouth once daily 90 tablet 3  . cyanocobalamin  (VITAMIN B12) 1,000 mcg/mL injection Inject 1 mL (1,000 mcg total) into the muscle monthly 1  shot every 2 weeks for 3 months and then monthly (Patient taking differently: Inject 1,000 mcg into the muscle monthly 1 shot every 2 weeks for 3 months and then monthly   HAS NOT TAKEN RECENTLY.) 10 mL 1  . DULoxetine (CYMBALTA) 60 MG DR capsule TAKE ONE CAPSULE BY MOUTH ONCE DAILY 90 capsule 3  . etodolac (LODINE) 400 MG tablet Take 1 tablet (400 mg total) by mouth 2 (two) times daily 60 tablet 11  . evolocumab (REPATHA SURECLICK) 140 mg/mL PnIj Inject 1 mL subcutaneously every 14 (fourteen) days 2 mL 11  . HYDROcodone-acetaminophen  (NORCO) 5-325 mg tablet Take 1 tablet by mouth 3 (three) times a day 90 tablet 0  . losartan-hydroCHLOROthiazide (HYZAAR) 100-25 mg tablet Take 1 tablet by mouth once daily 90 tablet 3  . pantoprazole (PROTONIX) 40 MG DR tablet TAKE 1 TABLET(40 MG) BY MOUTH DAILY 90 tablet 1  . sildenafiL (VIAGRA) 100 MG tablet Take 1 tablet (100 mg total) by mouth once daily as needed for Erectile Dysfunction 10 tablet 5  . tamsulosin (FLOMAX) 0.4 mg capsule TAKE 1 CAPSULE(0.4 MG) BY MOUTH DAILY 30 MINUTES AFTER THE SAME MEAL 90 capsule 3  . temazepam (RESTORIL) 15 mg capsule Take 1 capsule (15 mg total) by mouth at bedtime as needed for Sleep for up to 180 days 90 capsule 1  . traMADoL (ULTRAM) 50 mg tablet TAKE 1 TABLET(50 MG) BY MOUTH THREE TIMES DAILY 270 tablet 1   No current facility-administered medications for this visit.      HPI   CLINICAL SUMMARY:  Patient with trouble with erectile dysfunction.  Continues with pain issues.  A1c of 6.5 from 6.2.  Tox screen appropriately abnormal.  Has significant issues with erectile dysfunction  ROS: Review of systems is unremarkable for any active cardiac, respiratory, GI, GU, hematologic, neurologic, dermatologic, HEENT, or psychiatric symptoms except as noted above, 10 systems reviewed.  No fevers, chills, or constitutional symptoms.   PHYSICAL EXAM  Vital signs:  BP 130/80   Pulse 96   Wt (!) 108.6 kg (239 lb 6.4 oz)    SpO2 98%   BMI 33.33 kg/m  Body mass index is 33.33 kg/m.   Wt Readings from Last 3 Encounters:  11/24/23 (!) 108.6 kg (239 lb 6.4 oz)  09/01/23 (!) 115.4 kg (254 lb 6.4 oz)  05/27/23 (!) 110.7 kg (244 lb)     BP Readings from Last 3 Encounters:  11/24/23 130/80  09/01/23 122/80  05/27/23 (!) 140/82    Constitutional:NAD Neck: supple, no thyromegaly, good ROM Respiratory:clear to auscultation, no rales or wheezes Cardiovascular:RRR, no murmur or gallop Abdominal:soft, good BS, NT Ext: no edema, good peripheral pulses Neuro: alert and oriented X 3, grossly nonfocal     ASSESSMENT/PLAN   Type 2 diabetes-despite 15 pound weight gain, A1c up to 6.5, initiate low-dose Ozempic, hopefully can continue to lose further weight Erectile dysfunction-likely spinal stenosis is contributing to it, urology appointment B12 deficiency-back on B12 shots Abnormal PSA-PSA is stable at 1.0 Leukocytosis-secondary to vaping Chronic pain syndrome-continues on tramadol and hydrocodone, pain contract ordered, abnormal toxicology screen  Dispo:   Return in about 5 months (around 04/25/2024) for physical.

## 2023-12-23 ENCOUNTER — Ambulatory Visit: Admitting: Urology

## 2023-12-23 VITALS — BP 120/69 | HR 82 | Ht 70.0 in | Wt 236.4 lb

## 2023-12-23 DIAGNOSIS — N529 Male erectile dysfunction, unspecified: Secondary | ICD-10-CM | POA: Diagnosis not present

## 2023-12-23 DIAGNOSIS — Z125 Encounter for screening for malignant neoplasm of prostate: Secondary | ICD-10-CM

## 2023-12-23 NOTE — Progress Notes (Signed)
   12/23/23 4:20 PM   Darrell Schmidt 06-Jun-1960 [id number]  CC: ED, difficulty with orgasm, PSA screening  HPI: 63 year old male with a number of medical issues including chronic back pain, spinal stenosis, referred for the above issues.  He has difficulty reaching orgasm.  He takes a number of medications including Norco, Cymbalta, and tramadol that can affect the ejaculations.  He uses sildenafil 100 mg as needed with relatively good results for ED.  He had bothersome side effects from Cialis previously.  PSA was normal at 1.08.   PMH: Past Medical History:  Diagnosis Date   Depression    History of kidney stones    Hypertension    Sleep apnea    some apnea, but is waiting on Cpap machine     Surgical History: Past Surgical History:  Procedure Laterality Date   ANTERIOR CERVICAL DECOMP/DISCECTOMY FUSION N/A 09/20/2020   Procedure: ANTERIOR CERVICAL DECOMPRESSION/DISCECTOMY FUSION 1 LEVEL C6-7;  Surgeon: Clois Fret, MD;  Location: [facility] ORS;  Service: Neurosurgery;  Laterality: N/A;  1st case   BACK SURGERY  2015   lumbar Discectomy    BACK SURGERY  2016   Lumbar Discectomy    NOSE SURGERY  1980   nose surgery   1980   TOE SURGERY Right 2007   UPPER EXTREMITY ANGIOGRAPHY Left 04/09/2022   Procedure: Upper Extremity Angiography;  Surgeon: Jama Cordella MATSU, MD;  Location: [facility] INV[facility]IVE [facility] LAB;  Service: Cardiovascular;  Laterality: Left;   Family History: No family history on file.  Social History:  reports that he quit smoking about 14 years ago. His smoking use included cigarettes. He has never used smokeless tobacco. He reports that he does not drink alcohol and does not use drugs.  Physical Exam: BP 120/69 (BP Location: Left Arm, Patient Position: Sitting, Cuff Size: Large)   Pulse 82   Ht 5' 10 (1.778 m)   Wt 236 lb 6.4 oz (107.2 kg)   SpO2 96%   BMI 33.92 kg/m    Constitutional:  Alert and oriented, No acute distress. Cardiovascular: No clubbing,  cyanosis, or edema. Respiratory: Normal respiratory effort, no increased work of breathing. GI: Abdomen is soft, nontender, nondistended, no abdominal masses   Assessment & Plan:   63 year old male with spinal stenosis on Cymbalta, Norco, tramadol with difficulty with orgasm and ED.  ED relatively well-controlled on sildenafil.  We discussed the effect of the above medications on orgasm and ejaculation, and recommended considering alternatives if possible.  We discussed potential effect of testosterone  on ED and ejaculations and I recommended checking morning testosterone   Check morning testosterone , call with results Discussed side effects of his multiple medication on ejaculation/orgasm    Redell Burnet, MD 12/23/2023  [facility] [address] [place], [place] [address] [phone]

## 2023-12-23 NOTE — Patient Instructions (Signed)

## 2023-12-24 ENCOUNTER — Other Ambulatory Visit
Admission: RE | Admit: 2023-12-24 | Discharge: 2023-12-24 | Disposition: A | Discharge status: Home / Self Care | Attending: Urology | Admitting: Urology

## 2023-12-24 DIAGNOSIS — N529 Male erectile dysfunction, unspecified: Secondary | ICD-10-CM | POA: Insufficient documentation

## 2023-12-25 LAB — TESTOSTERONE: Testosterone: 170 ng/dL — ABNORMAL LOW (ref 264–916)

## 2023-12-26 ENCOUNTER — Ambulatory Visit: Payer: Self-pay | Admitting: Urology

## 2023-12-26 NOTE — Telephone Encounter (Signed)
 Pt scheduled

## 2024-01-13 ENCOUNTER — Other Ambulatory Visit

## 2024-01-13 ENCOUNTER — Other Ambulatory Visit
Admission: RE | Admit: 2024-01-13 | Discharge: 2024-01-13 | Disposition: A | Discharge status: Home / Self Care | Attending: Urology | Admitting: Urology

## 2024-01-13 ENCOUNTER — Other Ambulatory Visit: Payer: Self-pay

## 2024-01-13 DIAGNOSIS — N529 Male erectile dysfunction, unspecified: Secondary | ICD-10-CM

## 2024-01-14 LAB — TESTOSTERONE: Testosterone: 160 ng/dL — ABNORMAL LOW (ref 264–916)

## 2024-01-14 LAB — LUTEINIZING HORMONE: LH: 2.6 m[IU]/mL (ref 1.7–8.6)

## 2024-01-16 ENCOUNTER — Ambulatory Visit: Admitting: Urology

## 2024-01-16 ENCOUNTER — Encounter: Payer: Self-pay | Admitting: Urology

## 2024-01-29 ENCOUNTER — Ambulatory Visit (INDEPENDENT_AMBULATORY_CARE_PROVIDER_SITE_OTHER): Admitting: Urology

## 2024-01-29 VITALS — BP 123/74 | HR 70 | Wt 227.0 lb

## 2024-01-29 DIAGNOSIS — N529 Male erectile dysfunction, unspecified: Secondary | ICD-10-CM | POA: Diagnosis not present

## 2024-01-29 DIAGNOSIS — E291 Testicular hypofunction: Secondary | ICD-10-CM | POA: Diagnosis not present

## 2024-01-29 MED ORDER — CLOMIPHENE CITRATE 50 MG PO TABS: 25.0000 mg | ORAL_TABLET | Freq: Every day | ORAL | 6 refills | Status: DC

## 2024-01-29 NOTE — Progress Notes (Signed)
   01/29/2024 2:07 PM   Darrell Schmidt 1960/09/27 [id number]  Reason for visit: Follow up hypogonadism, difficulty with orgasm, ED  History: Initial visit July 2025 for ED responsive to sildenafil, but bothersome difficulty reaching ejaculation On a number of medications that likely contribute including Norco, Cymbalta, tramadol Testosterone  levels low and here for further evaluation and treatment options  Physical Exam: BP 123/74 (BP Location: Left Arm, Patient Position: Sitting, Cuff Size: Normal)   Pulse 70   Wt 227 lb (103 kg)   SpO2 96%   BMI 32.57 kg/m    Imaging/labs: Testosterone  170 and 160, LH 2.6, PSA 1.08  Today: Interested in TRT options with low testosterone  and difficulty with orgasm  Plan:   We reviewed the AUA guidelines regarding testosterone  evaluation and management and treatment options including gels, injections, and off-label Clomid .  After long conversation about the risks and benefits he was primarily interested in Clomid .  Trial of Clomid  25 mg daily Continue sildenafil for ED RTC 6 to 8 weeks morning testosterone  prior   Darrell JAYSON Burnet, MD  [facility] [address], [address] [place], [place] [address] [phone]

## 2024-01-29 NOTE — Patient Instructions (Signed)
 Your medication has been sent to [facility]. This is an Therapist, occupational that offers medication at a significantly discounted price. You will need to go to the website ([email or web address]) to sign up for an account, give your address and enter your payment method.

## 2024-02-25 ENCOUNTER — Other Ambulatory Visit: Payer: Self-pay | Admitting: Family Medicine

## 2024-02-25 DIAGNOSIS — M5416 Radiculopathy, lumbar region: Secondary | ICD-10-CM

## 2024-02-27 ENCOUNTER — Ambulatory Visit
Admission: RE | Admit: 2024-02-27 | Discharge: 2024-02-27 | Disposition: A | Discharge status: Home / Self Care | Source: Ambulatory Visit | Attending: Family Medicine | Admitting: Family Medicine

## 2024-02-27 DIAGNOSIS — M5416 Radiculopathy, lumbar region: Secondary | ICD-10-CM

## 2024-03-07 NOTE — Progress Notes (Unsigned)
 03/08/2024 9:36 AM   Dorn JONETTA Salt [id number] [id number]  Referring provider: Cleotilde Oneil FALCON, MD [phone] [facility] [address] [facility] [place],  [place] [address]  Urological history: 1. Hypogonadism - testosterone  level pending - hemoglobin/hematocrit pending  - Clomid  50 mg, 1/2 tablet daily  2. ED - failed tadalafil 5 mg - sildenafil 100 mg, on-demand-dosing  3. BPH with LU TS - PSA (10/2023) 1.08  No chief complaint on file.  HPI: EIRIK SCHUELER is a 63 y.o. man who presents today as he is not tolerating his Clomid  for TRT.    Previous records reviewed.  He was last seen by Dr. Francisca for hypogonadism and was started on Clomid .  He reported extreme insomnia after starting Clomid  50 mg, 1/2 tablet daily.    Testosterone  level (12/2023) 160, (11/2023) 170  Hemoglobin/hematocrit (10/2023) 13.1/39.4  Liver enzymes (10/2023) normal   I PSS ***  He reports sensation of incomplete bladder emptying, urinary frequency, urinary intermittency, urinary urgency, a weak urinary stream, having to strain to void, nocturia x ***, leaking before being able to reach the restroom, leaking with coughing, leaking without awareness, and post void dribbling.     He is wearing *** pads//depends  daily.    Patient denies any modifying or aggravating factors.  Patient denies any recent UTI's, gross hematuria, dysuria or suprapubic/flank pain.  Patient denies any fevers, chills, nausea or vomiting.  ***  He has a family history of PCa, colon cancer, ovarian cancer and/or breast cancer with ***.   He does not have a family history of PCa, colon cancer, ovarian cancer, and/or breast cancer .***     UA (04/2023) bland  PVR***  PSA (10/2023) 1.08  Serum creatinine (10/2023) 0.8, eGFR 99  Hemoglobin A1c (10/2023) 6.5  Fluid consumptiom: ***  SHIM ***  He does not have confidence that he could get and keep an erection, his erections are not firm enough for  penetrative intercourse, he has difficulty maintaining his erections,  and he is not finding intercourse satisfactory for him.  ***  Patient still having spontaneous erections.  ***   He denies any pain or curvature with erections.    He is not able to ejaculate, has pain with ejaculation, and has blood in his ejaculate fluid.   ***  Testosterone  level ***  Cholesterol (10/2023) 168  Hemoglobin A1c ***  TSH (10/2023) 4.395   Tried and failed tadalafil   PMH: Past Medical History:  Diagnosis Date   Depression    History of kidney stones    Hypertension    Sleep apnea    some apnea, but is waiting on Cpap machine     Surgical History: Past Surgical History:  Procedure Laterality Date   ANTERIOR CERVICAL DECOMP/DISCECTOMY FUSION N/A 09/20/2020   Procedure: ANTERIOR CERVICAL DECOMPRESSION/DISCECTOMY FUSION 1 LEVEL C6-7;  Surgeon: Clois Fret, MD;  Location: [facility] ORS;  Service: Neurosurgery;  Laterality: N/A;  1st case   BACK SURGERY  2015   lumbar Discectomy    BACK SURGERY  2016   Lumbar Discectomy    NOSE SURGERY  1980   nose surgery   1980   TOE SURGERY Right 2007   UPPER EXTREMITY ANGIOGRAPHY Left 04/09/2022   Procedure: Upper Extremity Angiography;  Surgeon: Jama Cordella MATSU, MD;  Location: [facility] INVASIVE CV LAB;  Service: Cardiovascular;  Laterality: Left;    Home Medications:  Allergies as of 03/08/2024       Reactions   Aspirin   Anaphylaxis   Ace Inhibitors Cough   Amlodipine Swelling   Atorvastatin    Other reaction(s): Muscle Pain   Rosuvastatin Other (See Comments)   Skin dryness, memory issues.        Medication List        Accurate as of March 07, 2024  9:36 AM. If you have any questions, ask your nurse or doctor.          clomiPHENE  50 MG tablet Commonly known as: CLOMID  Take 0.5 tablets (25 mg total) by mouth daily.   clopidogrel  75 MG tablet Commonly known as: PLAVIX  Take 1 tablet (75 mg total) by mouth daily.    cyanocobalamin  1000 MCG/ML injection Commonly known as: VITAMIN B12 1 shot every 2 weeks for 3 months and then monthly   DULoxetine 60 MG capsule Commonly known as: CYMBALTA Take 60 mg by mouth daily.   etodolac 400 MG tablet Commonly known as: LODINE Take 400 mg by mouth 2 (two) times daily.   HYDROcodone-acetaminophen  5-325 MG tablet Commonly known as: NORCO/VICODIN Take 1 tablet by mouth 3 (three) times daily as needed for pain.   losartan-hydrochlorothiazide 100-25 MG tablet Commonly known as: HYZAAR Take 1 tablet by mouth daily.   Mounjaro 7.5 MG/0.5ML Pen Generic drug: tirzepatide SMARTSIG:7.5 Milligram(s) SUB-Q Once a Week   Narcan 4 MG/0.1ML Liqd nasal spray kit Generic drug: naloxone Place 1 spray into the nose daily as needed for opioid reversal.   Ozempic (0.25 or 0.5 MG/DOSE) 2 MG/3ML Sopn Generic drug: Semaglutide(0.25 or 0.5MG /DOS) SMARTSIG:0.25 Milligram(s) SUB-Q Once a Week   pantoprazole 40 MG tablet Commonly known as: PROTONIX Take 40 mg by mouth daily at 4 PM.   Repatha SureClick 140 MG/ML Soaj Generic drug: Evolocumab Inject 1 mL into the skin every 14 (fourteen) days.   sildenafil 100 MG tablet Commonly known as: VIAGRA Take 100 mg by mouth daily.   tamsulosin 0.4 MG Caps capsule Commonly known as: FLOMAX Take 0.4 mg by mouth daily.   temazepam 15 MG capsule Commonly known as: RESTORIL Take 15 mg by mouth at bedtime as needed for sleep.   traMADol 50 MG tablet Commonly known as: ULTRAM Take 1 tablet by mouth 3 (three) times daily.        Allergies:  Allergies  Allergen Reactions   Aspirin  Anaphylaxis   Ace Inhibitors Cough   Amlodipine Swelling   Atorvastatin     Other reaction(s): Muscle Pain   Rosuvastatin Other (See Comments)    Skin dryness, memory issues.    Family History: No family history on file.  Social History:  reports that he quit smoking about 14 years ago. His smoking use included cigarettes. He has  never used smokeless tobacco. He reports that he does not drink alcohol and does not use drugs.  ROS: Pertinent ROS in HPI  Physical Exam: There were no vitals taken for this visit.  Constitutional:  Well nourished. Alert and oriented, No acute distress. HEENT: [place] AT, moist mucus membranes.  Trachea midline, no masses. Cardiovascular: No clubbing, cyanosis, or edema. Respiratory: Normal respiratory effort, no increased work of breathing. GI: Abdomen is soft, non tender, non distended, no abdominal masses. Liver and spleen not palpable.  No hernias appreciated.  Stool sample for occult testing is not indicated.   GU: No CVA tenderness.  No bladder fullness or masses.  Patient with circumcised/uncircumcised phallus. ***Foreskin easily retracted***  Urethral meatus is patent.  No penile discharge. No penile lesions or rashes. Scrotum without lesions, cysts,  rashes and/or edema.  Testicles are located scrotally bilaterally. No masses are appreciated in the testicles. Left and right epididymis are normal. Rectal: Patient with  normal sphincter tone. Anus and perineum without scarring or rashes. No rectal masses are appreciated. Prostate is approximately *** grams, *** nodules are appreciated. Seminal vesicles are normal. Skin: No rashes, bruises or suspicious lesions. Lymph: No cervical or inguinal adenopathy. Neurologic: Grossly intact, no focal deficits, moving all 4 extremities. Psychiatric: Normal mood and affect.  Laboratory Data: See HPI and Epic I have reviewed the labs.   Pertinent Imaging: N/A   Assessment & Plan:  1. Hypogonadism  - he has met criteria - he could not tolerate Clomid  - explained that TRT is not a treatment for ED, he may see some improvement in his erections, but his ED will likely persist even with therapeutic levels of testosterone  - discussed potential side effects of testosterone  replacement  including stimulation of erythrocytosis; edema; gynecomastia;  worsening sleep apnea; venous thromboembolism; testicular atrophy and infertility.   The theoretical risk of growth stimulation of an undetected prostate cancer was also discussed.  He was informed that current evidence does not provide any definitive answers regarding the risks of testosterone  therapy on prostate cancer and cardiovascular disease. The need for periodic monitoring of his testosterone  level, PSA, hematocrit and DRE was discussed.  This monitoring will be conducted every three months during the first year of TRT and then every 6 months if blood work remains stable, if there is an abnormality found in follow up blood work, it will result in the monitoring of blood work more frequently  - advised that once an individual starts TRT, it will be considered a life long therapy to keep testosterone  levels therapeutic  - advised that any missed or delayed appointments will also result in delays in the refilling of the TRT as it is a controlled substance - advised that the office requires one week to refill TRT - Near castrate testosterone  levels*** - Significant symptoms***feeling depressed or tired, having little to no interest in sex, low energy and weak muscles or bones - Recommend starting TRT*** - We discussed the most common forms of replacement including intramuscular injection and gels and he desires to start injections*** - Rx testosterone  cypionate-200 mg every 2 weeks to start*** - Appointment will be made for injection training*** - Follow-up 5 weeks after starting TRT for testosterone  level and symptom check*** - discussed that we follow guidelines for age appropriate testosterone  levels to decide on dosing regimens for TRT and this is based on current agreements in the medical community and we will not deviate from this unless there is good scientific data to do so [employer] may not recognize the parameters we use to determine that the patient has low testosterone  and  therefore, if they want to pursue TRT, it will have be out-of-pocket    60-69 [phone]) (3.7-18.9)  2. BPH with LU TS - stable, improving, worsening mild, moderate severe symptoms *** - no signs of retention, infection or malignancy *** - PSA up to date  - DRE benign *** - UA benign *** - PVR < 300 cc *** - most bothersome symptoms are *** - encouraged avoiding bladder irritants, fluid restriction before bedtime and timed voiding's - Initiate alpha-blocker (***), discussed side effects *** - Initiate 5 alpha reductase inhibitor (***), discussed side effects *** - Continue tamsulosin 0.4 mg daily, alfuzosin 10 mg daily, Rapaflo 8 mg daily, terazosin, doxazosin, Cialis 5 mg daily  and finasteride 5 mg daily, dutasteride 0.5 mg daily***:refills given - Cannot tolerate medication or medication failure, schedule cystoscopy *** - educated on red flag symptoms: acute retention, gross hematuria, fever, severe pain - advised to call clinic or go to the ED if these occur - return to clinic in *** symptom re-evaluation ***  3. ED  I explained that conditions like diabetes, hypertension, coronary artery disease, peripheral vascular disease, smoking, alcohol consumption, age, sleep apnea and BPH can diminish the ability to have an erection ***  I explained the ED may be a risk marker for underlying CVD and he should follow up with PCP for further studies ***  We will obtain a serum testosterone  level at this time; if it is abnormal we will need to repeat the study for confirmation ***  Explained that moderate to vigorous aerobic exercise for 40 minutes 4 times per week can decrease erectile problems caused by physical inactivity, obesity, hypertension, metabolic syndrome and/or cardiovascular diseases ***  We discussed trying a *** different PDE5 inhibitor, intra-urethral suppositories, ICI, vacuum erection devices, Li-SWT, and penile prosthesis implantation   No follow-ups on file.  These notes  generated with voice recognition software. I apologize for typographical errors.  CLOTILDA HELON RIGGERS  [facility] [address]  [address] [place], [place] [address] [phone]

## 2024-03-08 ENCOUNTER — Ambulatory Visit (INDEPENDENT_AMBULATORY_CARE_PROVIDER_SITE_OTHER): Admitting: Urology

## 2024-03-08 ENCOUNTER — Encounter: Payer: Self-pay | Admitting: Urology

## 2024-03-08 VITALS — BP 123/72 | HR 73 | Ht 71.0 in | Wt 222.2 lb

## 2024-03-08 DIAGNOSIS — N401 Enlarged prostate with lower urinary tract symptoms: Secondary | ICD-10-CM | POA: Diagnosis not present

## 2024-03-08 DIAGNOSIS — E291 Testicular hypofunction: Secondary | ICD-10-CM | POA: Diagnosis not present

## 2024-03-08 DIAGNOSIS — N138 Other obstructive and reflux uropathy: Secondary | ICD-10-CM

## 2024-03-08 DIAGNOSIS — N529 Male erectile dysfunction, unspecified: Secondary | ICD-10-CM

## 2024-03-16 ENCOUNTER — Ambulatory Visit: Admitting: Urology

## 2024-05-10 NOTE — Progress Notes (Deleted)
 Referring Physician:  Carlisle Benton CROME, FNP [address] [place],  [place] [address]  Primary Physician:  Darrell Oneil FALCON, MD  History of Present Illness: 05/10/2024 [name] is here today with a chief complaint of ***   Low back pain that will go into the thigh and calf, worse on the left side  Numbness and tingling  Duration: *** Location: *** Quality: *** Severity: ***  Precipitating: aggravated by *** Modifying factors: made better by *** Weakness: none Timing: *** Bowel/Bladder Dysfunction: none  Conservative measures:  Physical therapy: *** has participated in at [facility]? Was he discharged? Multimodal medical therapy including regular antiinflammatories: *** diclofenac, cymbalta, etodolac, gabapentin, hydrocodone, robaxin  Injections: ***  05/07/2024: Bilateral L5-S1 TF ESI  04/02/2024: Bilateral L5-S1 transforaminal ESI (2 days of relief of pain in the return of pain) 02/19/2024: Trigger point injection by Dr. Cleotilde (moderate relief) 08/03/2020: Right C6-7 transforaminal ESI 10/07/2013: Right L5-S1 transforaminal ESI (good relief)   Past Surgery: *** Lumbar discectomy in 2015 and Lumbar fusion in 2016 09/20/20: C6-7 ACDF   Darrell Schmidt has ***no symptoms of cervical myelopathy.  The symptoms are causing a significant impact on the patient's life.   I have utilized the care everywhere function in epic to review the outside records available from external health systems.   Review of Systems:  A 10 point review of systems is negative, except for the pertinent positives and negatives detailed in the HPI.  Past Medical History: Past Medical History:  Diagnosis Date   Depression    History of kidney stones    Hypertension    Sleep apnea    some apnea, but is waiting on Cpap machine     Past Surgical History: Past Surgical History:  Procedure Laterality Date   ANTERIOR CERVICAL DECOMP/DISCECTOMY FUSION N/A 09/20/2020   Procedure:  ANTERIOR CERVICAL DECOMPRESSION/DISCECTOMY FUSION 1 LEVEL C6-7;  Surgeon: Darrell Fret, MD;  Location: [facility] ORS;  Service: Neurosurgery;  Laterality: N/A;  1st case   BACK SURGERY  2015   lumbar Discectomy    BACK SURGERY  2016   Lumbar Discectomy    NOSE SURGERY  1980   nose surgery   1980   TOE SURGERY Right 2007   UPPER EXTREMITY ANGIOGRAPHY Left 04/09/2022   Procedure: Upper Extremity Angiography;  Surgeon: Darrell Cordella MATSU, MD;  Location: [facility] INVASIVE CV LAB;  Service: Cardiovascular;  Laterality: Left;    Allergies: Allergies as of 05/18/2024 - Review Complete 03/08/2024  Allergen Reaction Noted   Aspirin  Anaphylaxis 02/01/2015   Ace inhibitors Cough 09/14/2013   Amlodipine Swelling 07/02/2021   Atorvastatin  10/10/2013   Rosuvastatin Other (See Comments) 10/15/2013    Medications: Current Medications[1]  Social History: Social History[2]  Family Medical History: No family history on file.  Physical Examination: There were no vitals filed for this visit.  General: Patient is in no apparent distress. Attention to examination is appropriate.  Neck:   Supple.  Full range of motion.  Respiratory: Patient is breathing without any difficulty.   NEUROLOGICAL:     Awake, alert, oriented to person, place, and time.  Speech is clear and fluent.   Cranial Nerves: Pupils equal round and reactive to light.  Facial tone is symmetric.  Facial sensation is symmetric. Shoulder shrug is symmetric. Tongue protrusion is midline.  There is no pronator drift.  Strength: Side Biceps Triceps Deltoid Interossei Grip Wrist Ext. Wrist Flex.  R 5 5 5 5 5 5 5   L  5 5 5 5 5 5 5    Side Iliopsoas Quads Hamstring PF DF EHL  R 5 5 5 5 5 5   L 5 5 5 5 5 5    Reflexes are ***2+ and symmetric at the biceps, triceps, brachioradialis, patella and achilles.   Hoffman's is absent.   Bilateral upper and lower extremity sensation is intact to light touch.    No evidence of dysmetria  noted.  Gait is normal.     Medical Decision Making  Imaging: ***  I have personally reviewed the images and agree with the above interpretation.  Assessment and Plan: Darrell Schmidt is a pleasant 63 y.o. male with ***      Thank you for involving me in the care of this patient.      Darrell K. Clois MD, [facility] Neurosurgery     [1]  Current Outpatient Medications:    clopidogrel  (PLAVIX ) 75 MG tablet, Take 1 tablet (75 mg total) by mouth daily., Disp: 30 tablet, Rfl: 11   cyanocobalamin  (VITAMIN B12) 1000 MCG/ML injection, 1 shot every 2 weeks for 3 months and then monthly, Disp: , Rfl:    diazepam (VALIUM) 5 MG tablet, 1 po 30 minutes before MRI, Disp: , Rfl:    DULoxetine (CYMBALTA) 60 MG capsule, Take 60 mg by mouth daily., Disp: , Rfl:    etodolac (LODINE) 400 MG tablet, Take 400 mg by mouth 2 (two) times daily., Disp: , Rfl:    losartan-hydrochlorothiazide (HYZAAR) 100-25 MG tablet, Take 1 tablet by mouth daily., Disp: , Rfl:    MOUNJARO 7.5 MG/0.5ML Pen, SMARTSIG:7.5 Milligram(s) SUB-Q Once a Week, Disp: , Rfl:    NARCAN 4 MG/0.1ML LIQD nasal spray kit, Place 1 spray into the nose daily as needed for opioid reversal., Disp: , Rfl:    pantoprazole (PROTONIX) 40 MG tablet, Take 40 mg by mouth., Disp: , Rfl:    REPATHA SURECLICK 140 MG/ML SOAJ, Inject 1 mL into the skin every 14 (fourteen) days., Disp: , Rfl:    sildenafil (VIAGRA) 100 MG tablet, Take 100 mg by mouth daily., Disp: , Rfl:    tamsulosin (FLOMAX) 0.4 MG CAPS capsule, Take 0.4 mg by mouth daily., Disp: , Rfl:    temazepam (RESTORIL) 15 MG capsule, Take 15 mg by mouth at bedtime as needed for sleep., Disp: , Rfl:  [2]  Social History Tobacco Use   Smoking status: Former    Current packs/day: 0.00    Types: Cigarettes    Quit date: 05/27/2009    Years since quitting: 14.9   Smokeless tobacco: Never  Vaping Use   Vaping status: Every Day   Devices: with some nicotine   Substance Use Topics   Alcohol  use: Never   Drug use: Never

## 2024-05-12 ENCOUNTER — Other Ambulatory Visit

## 2024-05-13 ENCOUNTER — Other Ambulatory Visit: Admission: RE | Admit: 2024-05-13 | Discharge: 2024-05-13 | Disposition: A | Attending: Urology | Admitting: Urology

## 2024-05-13 ENCOUNTER — Encounter: Payer: Self-pay | Admitting: Urology

## 2024-05-13 DIAGNOSIS — N529 Male erectile dysfunction, unspecified: Secondary | ICD-10-CM | POA: Insufficient documentation

## 2024-05-14 LAB — TESTOSTERONE: Testosterone: 644 ng/dL (ref 264–916)

## 2024-05-17 ENCOUNTER — Ambulatory Visit: Payer: Self-pay | Admitting: Urology

## 2024-05-18 ENCOUNTER — Ambulatory Visit: Admitting: Neurosurgery

## 2024-06-01 ENCOUNTER — Other Ambulatory Visit: Payer: Self-pay

## 2024-06-01 MED ORDER — TADALAFIL 20 MG PO TABS: ORAL_TABLET | ORAL | 1 refills | Status: DC

## 2024-06-01 MED ORDER — TADALAFIL 20 MG PO TABS: ORAL_TABLET | ORAL | 1 refills | Status: AC

## 2024-07-02 NOTE — Progress Notes (Unsigned)
 "   Referring Physician:  Carlisle Benton CROME, FNP [address] [place],  [place] [address]  Primary Physician:  Cleotilde Oneil FALCON, MD  History of Present Illness: 07/02/2024 [name] is here today with a chief complaint of ***      Duration: *** Location: *** Quality: *** Severity: ***  Precipitating: aggravated by *** Modifying factors: made better by *** Weakness: none Timing: *** Bowel/Bladder Dysfunction: none  Conservative measures:  Physical therapy: ***  Multimodal medical therapy including regular antiinflammatories: ***  Injections: *** epidural steroid injections  Past Surgery: ***  DONTAVIOUS Diaz Crago has ***no symptoms of cervical myelopathy.  The symptoms are causing a significant impact on the patient's life.   I have utilized the care everywhere function in epic to review the outside records available from external health systems.   Review of Systems:  A 10 point review of systems is negative, except for the pertinent positives and negatives detailed in the HPI.  Past Medical History: Past Medical History:  Diagnosis Date   Depression    History of kidney stones    Hypertension    Sleep apnea    some apnea, but is waiting on Cpap machine     Past Surgical History: Past Surgical History:  Procedure Laterality Date   ANTERIOR CERVICAL DECOMP/DISCECTOMY FUSION N/A 09/20/2020   Procedure: ANTERIOR CERVICAL DECOMPRESSION/DISCECTOMY FUSION 1 LEVEL C6-7;  Surgeon: Clois Fret, MD;  Location: [facility] ORS;  Service: Neurosurgery;  Laterality: N/A;  1st case   BACK SURGERY  2015   lumbar Discectomy    BACK SURGERY  2016   Lumbar Discectomy    NOSE SURGERY  1980   nose surgery   1980   TOE SURGERY Right 2007   UPPER EXTREMITY ANGIOGRAPHY Left 04/09/2022   Procedure: Upper Extremity Angiography;  Surgeon: Jama Cordella MATSU, MD;  Location: [facility] INV[facility];  Service: Cardiovascular;  Laterality: Left;    Allergies: Allergies as of  07/13/2024 - Review Complete 03/08/2024  Allergen Reaction Noted   Aspirin  Anaphylaxis 02/01/2015   Ace inhibitors Cough 09/14/2013   Amlodipine Swelling 07/02/2021   Atorvastatin  10/10/2013   Rosuvastatin Other (See Comments) 10/15/2013    Medications: Current Medications[1]  Social History: Social History[2]  Family Medical History: No family history on file.  Physical Examination: There were no vitals filed for this visit.  General: Patient is in no apparent distress. Attention to examination is appropriate.  Neck:   Supple.  Full range of motion.  Respiratory: Patient is breathing without any difficulty.   NEUROLOGICAL:     Awake, alert, oriented to person, place, and time.  Speech is clear and fluent.   Cranial Nerves: Pupils equal round and reactive to light.  Facial tone is symmetric.  Facial sensation is symmetric. Shoulder shrug is symmetric. Tongue protrusion is midline.  There is no pronator drift.  Strength: Side Biceps Triceps Deltoid Interossei Grip Wrist Ext. Wrist Flex.  R 5 5 5 5 5 5 5   L 5 5 5 5 5 5 5    Side Iliopsoas Quads Hamstring PF DF EHL  R 5 5 5 5 5 5   L 5 5 5 5 5 5    Reflexes are ***2+ and symmetric at the biceps, triceps, brachioradialis, patella and achilles.   Hoffman's is absent.   Bilateral upper and lower extremity sensation is intact to light touch.    No evidence of dysmetria noted.  Gait is normal.     Medical Decision Making  Imaging: ***  I have personally reviewed the images and agree with the above interpretation.  Assessment and Plan: Mr. Venning is a pleasant 64 y.o. male with ***      Thank you for involving me in the care of this patient.      Chester K. Clois MD, [facility] Neurosurgery     [1]  Current Outpatient Medications:    clopidogrel  (PLAVIX ) 75 MG tablet, Take 1 tablet (75 mg total) by mouth daily., Disp: 30 tablet, Rfl: 11   cyanocobalamin  (VITAMIN B12) 1000 MCG/ML injection, 1 shot every 2  weeks for 3 months and then monthly, Disp: , Rfl:    diazepam (VALIUM) 5 MG tablet, 1 po 30 minutes before MRI, Disp: , Rfl:    diclofenac (VOLTAREN) 75 MG EC tablet, Take 75 mg by mouth 2 (two) times daily., Disp: , Rfl:    DULoxetine (CYMBALTA) 60 MG capsule, Take 60 mg by mouth daily., Disp: , Rfl:    etodolac (LODINE) 400 MG tablet, Take 400 mg by mouth 2 (two) times daily., Disp: , Rfl:    gabapentin (NEURONTIN) 300 MG capsule, Take by mouth., Disp: , Rfl:    losartan-hydrochlorothiazide (HYZAAR) 100-25 MG tablet, Take 1 tablet by mouth daily., Disp: , Rfl:    methocarbamol  (ROBAXIN ) 750 MG tablet, 1/2-1 po qHS prn, Disp: , Rfl:    MOUNJARO 7.5 MG/0.5ML Pen, SMARTSIG:7.5 Milligram(s) SUB-Q Once a Week, Disp: , Rfl:    NARCAN 4 MG/0.1ML LIQD nasal spray kit, Place 1 spray into the nose daily as needed for opioid reversal., Disp: , Rfl:    pantoprazole (PROTONIX) 40 MG tablet, Take 40 mg by mouth., Disp: , Rfl:    REPATHA SURECLICK 140 MG/ML SOAJ, Inject 1 mL into the skin every 14 (fourteen) days., Disp: , Rfl:    sildenafil (VIAGRA) 100 MG tablet, Take 100 mg by mouth daily., Disp: , Rfl:    tadalafil  (CIALIS ) 20 MG tablet, Take 2 tab 30 hour prior to intercourse, Disp: 30 tablet, Rfl: 1   tamsulosin (FLOMAX) 0.4 MG CAPS capsule, Take 0.4 mg by mouth daily., Disp: , Rfl:    temazepam (RESTORIL) 15 MG capsule, Take 15 mg by mouth at bedtime as needed for sleep., Disp: , Rfl:  [2]  Social History Tobacco Use   Smoking status: Former    Current packs/day: 0.00    Types: Cigarettes    Quit date: 05/27/2009    Years since quitting: 15.1   Smokeless tobacco: Never  Vaping Use   Vaping status: Every Day   Devices: with some nicotine   Substance Use Topics   Alcohol use: Never   Drug use: Never   "

## 2024-07-13 ENCOUNTER — Ambulatory Visit: Admitting: Neurosurgery
